# Patient Record
Sex: Female | Born: 1955 | Race: White | Hispanic: No | Marital: Married | Smoking: Never smoker
Health system: Southern US, Community
[De-identification: ages and names within clinical notes are randomized; demographics above are authoritative.]

## PROBLEM LIST (undated history)

## (undated) DIAGNOSIS — I10 Essential (primary) hypertension: Secondary | ICD-10-CM

## (undated) DIAGNOSIS — E119 Type 2 diabetes mellitus without complications: Secondary | ICD-10-CM

## (undated) DIAGNOSIS — M1990 Unspecified osteoarthritis, unspecified site: Secondary | ICD-10-CM

## (undated) DIAGNOSIS — R7301 Impaired fasting glucose: Secondary | ICD-10-CM

## (undated) DIAGNOSIS — J45909 Unspecified asthma, uncomplicated: Secondary | ICD-10-CM

## (undated) HISTORY — PX: EYE SURGERY: SHX253

## (undated) HISTORY — PX: HERNIA REPAIR: SHX51

## (undated) HISTORY — PX: CARDIAC SURGERY: SHX584

---

## 1998-02-24 ENCOUNTER — Inpatient Hospital Stay (HOSPITAL_COMMUNITY): Admission: AD | Admit: 1998-02-24 | Discharge: 1998-02-24 | Payer: Self-pay | Admitting: Gynecology

## 1998-09-11 ENCOUNTER — Inpatient Hospital Stay (HOSPITAL_COMMUNITY): Admission: AD | Admit: 1998-09-11 | Discharge: 1998-09-11 | Payer: Self-pay | Admitting: Obstetrics and Gynecology

## 1998-10-08 ENCOUNTER — Inpatient Hospital Stay (HOSPITAL_COMMUNITY): Admission: AD | Admit: 1998-10-08 | Discharge: 1998-10-08 | Payer: Self-pay | Admitting: Obstetrics & Gynecology

## 1998-10-15 ENCOUNTER — Inpatient Hospital Stay (HOSPITAL_COMMUNITY): Admission: AD | Admit: 1998-10-15 | Discharge: 1998-10-18 | Payer: Self-pay | Admitting: Obstetrics and Gynecology

## 1998-10-22 ENCOUNTER — Inpatient Hospital Stay (HOSPITAL_COMMUNITY): Admission: AD | Admit: 1998-10-22 | Discharge: 1998-10-22 | Payer: Self-pay | Admitting: *Deleted

## 1998-10-23 ENCOUNTER — Inpatient Hospital Stay (HOSPITAL_COMMUNITY): Admission: AD | Admit: 1998-10-23 | Discharge: 1998-10-23 | Payer: Self-pay | Admitting: Obstetrics & Gynecology

## 1999-07-08 ENCOUNTER — Other Ambulatory Visit: Admission: RE | Admit: 1999-07-08 | Discharge: 1999-07-08 | Payer: Self-pay | Admitting: Obstetrics and Gynecology

## 1999-12-07 ENCOUNTER — Inpatient Hospital Stay (HOSPITAL_COMMUNITY): Admission: AD | Admit: 1999-12-07 | Discharge: 1999-12-07 | Payer: Self-pay | Admitting: Obstetrics and Gynecology

## 2000-01-07 ENCOUNTER — Inpatient Hospital Stay (HOSPITAL_COMMUNITY): Admission: AD | Admit: 2000-01-07 | Discharge: 2000-01-10 | Payer: Self-pay | Admitting: Obstetrics and Gynecology

## 2000-01-07 ENCOUNTER — Encounter (INDEPENDENT_AMBULATORY_CARE_PROVIDER_SITE_OTHER): Payer: Self-pay

## 2000-03-10 ENCOUNTER — Other Ambulatory Visit: Admission: RE | Admit: 2000-03-10 | Discharge: 2000-03-10 | Payer: Self-pay | Admitting: Obstetrics and Gynecology

## 2002-11-29 ENCOUNTER — Other Ambulatory Visit: Admission: RE | Admit: 2002-11-29 | Discharge: 2002-11-29 | Payer: Self-pay | Admitting: Obstetrics and Gynecology

## 2003-01-25 ENCOUNTER — Encounter: Admission: RE | Admit: 2003-01-25 | Discharge: 2003-04-25 | Payer: Self-pay | Admitting: Obstetrics and Gynecology

## 2003-05-10 ENCOUNTER — Encounter: Admission: RE | Admit: 2003-05-10 | Discharge: 2003-08-08 | Payer: Self-pay | Admitting: Obstetrics and Gynecology

## 2003-05-30 ENCOUNTER — Encounter: Payer: Self-pay | Admitting: Family Medicine

## 2003-05-30 ENCOUNTER — Ambulatory Visit (HOSPITAL_COMMUNITY): Admission: RE | Admit: 2003-05-30 | Discharge: 2003-05-30 | Payer: Self-pay | Admitting: Family Medicine

## 2003-06-06 ENCOUNTER — Encounter: Payer: Self-pay | Admitting: Family Medicine

## 2003-06-06 ENCOUNTER — Ambulatory Visit (HOSPITAL_COMMUNITY): Admission: RE | Admit: 2003-06-06 | Discharge: 2003-06-06 | Payer: Self-pay | Admitting: Family Medicine

## 2003-06-15 ENCOUNTER — Encounter (INDEPENDENT_AMBULATORY_CARE_PROVIDER_SITE_OTHER): Payer: Self-pay | Admitting: *Deleted

## 2003-06-15 ENCOUNTER — Inpatient Hospital Stay (HOSPITAL_COMMUNITY): Admission: RE | Admit: 2003-06-15 | Discharge: 2003-06-17 | Payer: Self-pay | Admitting: General Surgery

## 2004-02-11 ENCOUNTER — Other Ambulatory Visit: Admission: RE | Admit: 2004-02-11 | Discharge: 2004-02-11 | Payer: Self-pay | Admitting: Gynecology

## 2005-08-24 ENCOUNTER — Other Ambulatory Visit: Admission: RE | Admit: 2005-08-24 | Discharge: 2005-08-24 | Payer: Self-pay | Admitting: Gynecology

## 2006-08-24 ENCOUNTER — Other Ambulatory Visit: Admission: RE | Admit: 2006-08-24 | Discharge: 2006-08-24 | Payer: Self-pay | Admitting: Gynecology

## 2007-08-18 ENCOUNTER — Encounter: Payer: Self-pay | Admitting: Emergency Medicine

## 2007-08-18 ENCOUNTER — Ambulatory Visit (HOSPITAL_COMMUNITY): Admission: RE | Admit: 2007-08-18 | Discharge: 2007-08-18 | Payer: Self-pay | Admitting: Family Medicine

## 2007-09-19 ENCOUNTER — Ambulatory Visit: Payer: Self-pay | Admitting: Pulmonary Disease

## 2007-09-19 DIAGNOSIS — E119 Type 2 diabetes mellitus without complications: Secondary | ICD-10-CM | POA: Insufficient documentation

## 2007-09-19 DIAGNOSIS — I10 Essential (primary) hypertension: Secondary | ICD-10-CM | POA: Insufficient documentation

## 2007-09-21 ENCOUNTER — Ambulatory Visit: Payer: Self-pay | Admitting: Cardiovascular Disease

## 2007-10-04 ENCOUNTER — Telehealth: Payer: Self-pay | Admitting: Pulmonary Disease

## 2007-10-24 ENCOUNTER — Other Ambulatory Visit: Admission: RE | Admit: 2007-10-24 | Discharge: 2007-10-24 | Payer: Self-pay | Admitting: Gynecology

## 2011-02-27 NOTE — Discharge Summary (Signed)
[facility]  Patient:    Terri Nicholson, Terri Nicholson                     MRN: [id number] Adm. Date:  [id number] Disc. Date: [id number] Attending:  Cordelia Pen Ii Dictator:   Danie Chandler, R.N.                           Discharge Summary  ADMITTING DIAGNOSES:          1. Intrauterine pregnancy at 49 weeks estimated                                  gestational age.                               2. Pregnancy-induced hypertension.                               3. Previous cesarean section x 3.  DISCHARGE DIAGNOSES:          1. Intrauterine pregnancy at 51 weeks estimated                                  gestational age.                               2. Pregnancy-induced hypertension.                               3. Previous cesarean section x 3.                               4. Umbilical hernia.                               5. Upper respiratory infection.  PROCEDURES:                   1. On January 07, 2000, repeat low transverse cesarean                                  section.                               2. Umbilical herniorrhaphy.  REASON FOR ADMISSION:         The patient is a 55 year old married white female  gravida 5 para 3 with an estimated date of confinement of January 14, 2000.  Her prenatal care has been complicated by cesarean section x 3, history of PIH with  previous pregnancy, and history of fibroids.  The patient presented to the office on January 07, 2000 and was noted to have a blood pressure of 150s/90s and 1+ proteinuria.  She had had increasing pitting pedal edema.  She had no headache, no vision change or epigastric pain.  The recommendation for delivery was made.  HOSPITAL COURSE:              The patient was taken to the operating room and underwent the above-named procedure without complication.  This was productive f a viable female infant with Apgars of 8 at one minute and 9 at five minutes. Postoperatively, the patient  did well.  On postoperative day #1, the patient had good pain control.  Her hemoglobin was 9.7, hematocrit 27.7, and white blood cell count 8.5.  Her blood pressures were stable.  On postoperative day #2, the patient had a good return of bowel function and was tolerating a regular diet.  She was  also ambulating well without difficulty.  Her hemoglobin was stable at 11.2. She was complaining of a productive cough with yellow phlegm, consistent with an upper respiratory infection, and she was requesting antibiotics.  She was started on amoxicillin.  On postoperative day #3, she was discharged home.  CONDITION ON DISCHARGE:       Good.  DIET:                         Regular as tolerated.  ACTIVITY:                     No heavy lifting, no driving, no vaginal entry.  FOLLOW-UP:                    She is to follow up in the office on January 12, 2000 for staple removal.  She is to call for temperature greater than 100 degrees, persistent nausea or vomiting, heavy vaginal bleeding, and/or redness or drainage from the incision site.  DISCHARGE MEDICATIONS:        1. Prenatal vitamin 1 p.o. q.d.                               2. Tylox 1-2 p.o. q.4-6h. p.r.n. pain.                               3. Amoxicillin 500 mg 1 p.o. t.i.d. DD:  01/28/00 TD:  01/28/00 Job: [id number]

## 2011-02-27 NOTE — Op Note (Signed)
NAMEMarland Nicholson  ASALEE, BARRETTE NO.:  [id number]   MEDICAL RECORD NO.:  [id number]                   PATIENT TYPE:  INP   LOCATION:  5730                                 FACILITY:  [facility]   PHYSICIAN:  Leonie Man, M.D.                DATE OF BIRTH:  10-10-1956   DATE OF PROCEDURE:  06/15/2003  DATE OF DISCHARGE:                                 OPERATIVE REPORT   PREOPERATIVE DIAGNOSES:  1. Ventral hernia with incarcerated transverse colon.  2. Umbilical hernia with incarcerated fat.   POSTOPERATIVE DIAGNOSES:  1. Ventral hernia with incarcerated transverse colon.  2. Umbilical hernia with incarcerated fat.   PROCEDURE:  Repair of ventral and umbilical hernias with mesh.   SURGEON:  Leonie Man, M.D.   ASSISTANT:  Nurse.   ANESTHESIA:  General.   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  On CT scan she is noted to have  incarcerated ventral hernias both at the umbilicus and superior to the  umbilicus.   After satisfactory general anesthesia, the patient is positioned supinely  and the abdomen is prepped and draped to be included in the sterile  operative field.  A midepigastric incision is deepened through the skin and  subcutaneous tissues, extending down to the umbilicus.  Dissection is  carried down to reveal a large hernia sac containing omentum within the soft  tissues of the abdominal wall.  This was dissected free on all sides,  carried down to the fascia, and the fascia was then closed upon.  The large  hernia sac was opened and the transverse colon and associated omentum was  dissected free from the hernia sac and reduced into the abdominal cavity.  The hernia sac was removed and forwarded for pathologic evaluation along  with some incarcerated umbilical fat.  The defect was cleared of all  adhesions.  I placed some Seprafilm within the abdominal cavity so as to  avoid  adhesions to the mesh.  I fashioned a Marlex mesh, which was sewn into  this 4 x 4 cm defect, which I later extended down toward the umbilicus to  include the umbilical defect also.  The mesh was sewn in with a running #1  Novofil suture.  The mesh repair was noted to be excellent.  Sponge,  instrument, and sharp counts were verified and the wound closed in layers.  The subcutaneous tissue was closed with a running 2-0 Vicryl and the skin  was closed with a running 4-0 Monocryl.  A 19 mm Blake drain was placed into  the dead space left by the incarcerated hernia in the abdominal wall.  This  was secured to the skin with a 4-0 Monocryl suture.  At the end of the  procedure all sponge, instrument, and sharp counts were verified and sterile  dressings were then applied, the anesthetic reversed,  and the patient  removed from the operating room to the recovery room in stable condition.  She tolerated the procedure well.                                               Leonie Man, M.D.   PB/MEDQ  D:  06/15/2003  T:  06/16/2003  Job:  [id number]

## 2011-02-27 NOTE — Op Note (Signed)
[facility]  Patient:    Terri Nicholson, Terri Nicholson                     MRN: [id number] Proc. Date: 01/07/00 Adm. Date:  [id number] Attending:  Cordelia Pen Ii                           Operative Report  PREOPERATIVE DIAGNOSES:       1. Intrauterine pregnancy at 59 weeks estimated                                  gestational age.                               2. Pregnancy induced hypertension.                               3. Previous cesarean section x 3.                               4. Possible maternal urachal cyst on ultrasound.  POSTOPERATIVE DIAGNOSES:      1. Intrauterine pregnancy at 20 weeks estimated                                  gestational age.                               2. Pregnancy induced hypertension.                               3. Previous cesarean section x 3.                               4. Umbilical hernia.  PROCEDURE:                    1. Repeat low transverse cesarean section.                               2. Umbilical herniorrhaphy.  SURGEON:                      Guy Sandifer. Arleta Creek, M.D.  ANESTHESIA:                   Spinal-epidural, general with endotracheal intubation, Laqueta Due, M.D.  ESTIMATED BLOOD LOSS:         1000 cc.  FINDINGS:                     Viable female infant.  Apgars of 8 and 9 at one and  five minutes respectively.  Birth weight 10 pounds 3 ounces.  Arterial cord pH pending.  INDICATIONS AND CONSENT:      This is a 55 year old married white female G5, P3, AB1 with an EDC of January 14, 2000 with prenatal care complicated by cesarean section x  3, history of PIH with previous pregnancies, history of fibroids, suspicion of a maternal urachal cyst on ultrasound.  Today the patient presented to office and was noted to have a blood pressure of 150s/90s and 1+ proteinuria.  She had had increasing pitting pedal edema.  She had no headache, no vision change, no epigastric pain.  Recommendation  for delivery was made.  Repeat cesarean section had been discussed.  Removal of a possible urachal cyst was also discussed. Possible risks and complications of the procedure had also been discussed.  PROCEDURE:                    Patient was taken to the operating room where an epidural anesthetic is placed and is ineffective.  This is then replaced with a  combined spinal-epidural approach.  The patient was then placed in the dorsal spine position with a 15 degree left lateral wedge where she is prepped.  Bladder is catheterization with a Foley catheter.  She is draped in a sterile fashion. After testing for adequate anesthesia, the skin is entered through a Pfannenstiel incision.  Dissection is carried out in layers.  The subfascial adhesions are very dense.  The peritoneum is entered and extended superiorly and inferiorly.  The bladder flap is developed.  The bladder blade is placed.  The uterus is incised in a low transverse manner.  The uterine incision extended ______ to fingers. Artificial rupture of membranes was then carried out for clear fluid.  The infant is then delivered from the vertex presentation.  Where nasopharynx was thoroughly suctioned.  Good cry and tone is noted.  The cord is clamped and cut.  The infant is handed away to pediatrics team.  Placenta is manually delivered and sent to pathology for examination.  The uterus is then closed in a running locking manner with 0 Monocryl suture which achieves good hemostasis.  Inspection of the potential urachal cyst reveals it is in fact an umbilical hernia with omentum adherent to it.  Just a simple exploration of this is extremely uncomfortable for the patient. She wants to have this repaired and therefore general anesthesia of the endotracheal intubation is then carried out.  Patient is then placed in some Trendelenburg position and the anterior abdominal wall is retracted to provide good exposure.  The small  amount of omentum adherent to the under side of the hernia is then easily taken down with cautery without difficulty.  This is done well clear of the bowel and the bowel had in fact been packed away with a ______ pack with a tag on it.  Then 0 ______ suture is used in an interrupted fashion to close the hernia. Careful inspection reveals the hernia to be completely closed with no gaps noticed.  The pack is then removed.  Copious irrigation is carried out and good hemostasis is noted.  Ovaries are normal bilaterally.  Anterior peritoneum is then closed n running fashion with 0 Monocryl suture which is also used to reapproximate the pyramidalis muscle in the midline.  Anterior rectus fascia is closed in a running fashion with 0 PDS suture and the skin is closed with clips.  All sponge, instrument, and needle counts are correct and the patient is transferred to the  recovery room in stable condition. DD:  01/07/00 TD:  01/07/00 Job: [id number]

## 2011-02-27 NOTE — Discharge Summary (Signed)
   NAMEALTAIR, STANKO NO.:  [id number]   MEDICAL RECORD NO.:  [id number]                   PATIENT TYPE:  INP   LOCATION:  5730                                 FACILITY:  [facility]   PHYSICIAN:  Leonie Man, M.D.                DATE OF BIRTH:  03-15-1956   DATE OF ADMISSION:  06/15/2003  DATE OF DISCHARGE:  06/17/2003                                 DISCHARGE SUMMARY   ADMISSION DIAGNOSES:  1. Incarcerated ventral hernia.  2. Incarcerated umbilical hernia.   DISCHARGE DIAGNOSES:  1. Incarcerated ventral hernia.  2. Incarcerated umbilical hernia.   PROCEDURE:  Repair of incarcerated ventral and incarcerated umbilical  hernias.   SURGEON:  Leonie Man, M.D.   DISCHARGE CONDITION:  Improved.   HOSPITAL COURSE:  The patient is a 55 year old girl admitted to the hospital  following surgery on June 15, 2003, where she underwent repair of an  incarcerated ventral and umbilical hernia.  Postoperative course has been  benign with resumption of diet and activity.  On June 17, 2003, she was  discharged home to be followed up in the office in two weeks.   DISCHARGE MEDICATIONS:  Vicodin one to two every four hours p.r.n. for pain.   ACTIVITY:  Ad lib.   DIET:  As tolerated.                                                Leonie Man, M.D.    PB/MEDQ  D:  07/12/2003  T:  07/12/2003  Job:  [id number]

## 2012-04-05 ENCOUNTER — Emergency Department
Admission: EM | Admit: 2012-04-05 | Discharge: 2012-04-05 | Disposition: A | Discharge status: Home / Self Care | Payer: PRIVATE HEALTH INSURANCE | Source: Home / Self Care | Attending: Emergency Medicine | Admitting: Emergency Medicine

## 2012-04-05 DIAGNOSIS — J069 Acute upper respiratory infection, unspecified: Secondary | ICD-10-CM

## 2012-04-05 DIAGNOSIS — R05 Cough: Secondary | ICD-10-CM

## 2012-04-05 DIAGNOSIS — R062 Wheezing: Secondary | ICD-10-CM

## 2012-04-05 HISTORY — DX: Essential (primary) hypertension: I10

## 2012-04-05 HISTORY — DX: Unspecified asthma, uncomplicated: J45.909

## 2012-04-05 HISTORY — DX: Impaired fasting glucose: R73.01

## 2012-04-05 MED ORDER — PREDNISONE (PAK) 10 MG PO TABS: 10.0000 mg | ORAL_TABLET | Freq: Every day | ORAL | Status: AC

## 2012-04-05 MED ORDER — IPRATROPIUM-ALBUTEROL 0.5-2.5 (3) MG/3ML IN SOLN
3.0000 mL | Freq: Once | RESPIRATORY_TRACT | Status: AC
  Administered 2012-04-05: 3 mL via RESPIRATORY_TRACT

## 2012-04-05 MED ORDER — METHYLPREDNISOLONE SODIUM SUCC 125 MG IJ SOLR
125.0000 mg | Freq: Once | INTRAMUSCULAR | Status: AC
  Administered 2012-04-05: 125 mg via INTRAMUSCULAR

## 2012-04-05 MED ORDER — AZITHROMYCIN 250 MG PO TABS: ORAL_TABLET | ORAL | Status: AC

## 2012-04-05 NOTE — ED Provider Notes (Signed)
History     CSN: [id number]  Arrival date & time 04/05/12  1924   First MD Initiated Contact with Patient 04/05/12 1927      Chief Complaint  Patient presents with  . Cough    3 days    (Consider location/radiation/quality/duration/timing/severity/associated sxs/prior treatment) HPI Terri Nicholson is a 56 y.o. female who complains of onset of cold symptoms for a few days.  The symptoms are constant and mild-moderate in severity.  She has mild diabetes.  Her husband has similar symptoms and isn't getting much better on Amoxicillin. No sore throat + cough + hoarseness No pleuritic pain + wheezing + nasal congestion + post-nasal drainage + sinus pain/pressure + chest congestion No itchy/red eyes No earache No hemoptysis No SOB No chills/sweats +/- fever No nausea No vomiting No abdominal pain No diarrhea No skin rashes + fatigue No myalgias No headache    Past Medical History  Diagnosis Date  . Asthma   . Hypertension   . IFG (impaired fasting glucose)     Past Surgical History  Procedure Date  . Cardiac surgery   . Hernia repair   . Cesarean section     Family History  Problem Relation Age of Onset  . Cancer Father     skin    History  Substance Use Topics  . Smoking status: Never Smoker   . Smokeless tobacco: Never Used  . Alcohol Use: No    OB History    Grav Para Term Preterm Abortions TAB SAB Ect Mult Living                  Review of Systems  All other systems reviewed and are negative.    Allergies  Review of patient's allergies indicates no known allergies.  Home Medications   Current Outpatient Rx  Name Route Sig Dispense Refill  . ALBUTEROL SULFATE HFA 108 (90 BASE) MCG/ACT IN AERS Inhalation Inhale 2 puffs into the lungs every 6 (six) hours as needed.    . BUDESONIDE-FORMOTEROL FUMARATE 160-4.5 MCG/ACT IN AERO Inhalation Inhale 2 puffs into the lungs 2 (two) times daily.    . OMEGA-3 FATTY ACIDS 1000 MG PO CAPS Oral Take 2 g by  mouth daily.    Marland Kitchen LOSARTAN POTASSIUM 50 MG PO TABS Oral Take 50 mg by mouth daily.    Marland Kitchen PRENATAL MULTIVITAMIN CH Oral Take 1 tablet by mouth daily.    . TRIAMTERENE-HCTZ 37.5-25 MG PO TABS Oral Take 1 tablet by mouth daily.    . AZITHROMYCIN 250 MG PO TABS  Use as directed 1 each 0  . PREDNISONE (PAK) 10 MG PO TABS Oral Take 1 tablet (10 mg total) by mouth daily. 6 day pack, use as directed, Disp 1 pack 21 tablet 0    BP 119/82  Pulse 100  Temp 98 F (36.7 C) (Oral)  Resp 18  Ht 5' (1.524 m)  Wt 221 lb (100.245 kg)  BMI 43.16 kg/m2  SpO2 96%  Physical Exam  Nursing note and vitals reviewed. Constitutional: She is oriented to person, place, and time. She appears well-developed and well-nourished.  Non-toxic appearance. No distress.  HENT:  Head: Normocephalic and atraumatic.  Right Ear: Tympanic membrane, external ear and ear canal normal.  Left Ear: Tympanic membrane, external ear and ear canal normal.  Nose: Mucosal edema present.  Mouth/Throat: No oropharyngeal exudate, posterior oropharyngeal edema or posterior oropharyngeal erythema.  Eyes: No scleral icterus.  Neck: Neck supple.  Cardiovascular: Regular rhythm and normal  heart sounds.   Pulmonary/Chest: Effort normal. No accessory muscle usage. No respiratory distress. She has no decreased breath sounds. She has wheezes (scattered bilateral). She has no rhonchi.  Neurological: She is alert and oriented to person, place, and time.  Skin: Skin is warm and dry.  Psychiatric: She has a normal mood and affect. Her speech is normal.    ED Course  Procedures (including critical care time)  Labs Reviewed - No data to display No results found.   1. Acute upper respiratory infections of unspecified site   2. Cough       MDM  1)  a nebulizer treatment with DuoNeb was done in clinic.  After that nebulizer treatment, she felt much better, able to breathe much better.  In addition her examination improved greatly and she had  no wheezes.  I gave her a shot of site Medrol in clinic.  I also gave her prescription for prednisone and a Z-Pak.  She can hold off until tomorrow on that medicine and she may not even need it if she is much improved. 2)  Use nasal saline solution (over the counter) at least 3 times a day. 3)  Use over the counter decongestants like Zyrtec-D every 12 hours as needed to help with congestion.  If you have hypertension, do not take medicines with sudafed.  4)  Can take tylenol every 6 hours for pain or fever. 5)  Follow up with your primary doctor if no improvement in 5-7 days, sooner if increasing pain, fever, or new symptoms.     Marlaine Hind, MD 04/05/12 2000

## 2012-04-05 NOTE — ED Notes (Signed)
Terri Nicholson complains of productive cough with yellow sputum and shortness of breath with activity for 3 days

## 2015-01-14 ENCOUNTER — Encounter: Payer: Self-pay | Admitting: *Deleted

## 2015-02-14 ENCOUNTER — Other Ambulatory Visit: Payer: Self-pay | Admitting: Orthopedic Surgery

## 2015-02-14 DIAGNOSIS — M25571 Pain in right ankle and joints of right foot: Secondary | ICD-10-CM

## 2015-02-18 ENCOUNTER — Emergency Department
Admission: EM | Admit: 2015-02-18 | Discharge: 2015-02-19 | Disposition: A | Discharge status: Home / Self Care | Payer: PRIVATE HEALTH INSURANCE | Attending: Emergency Medicine | Admitting: Emergency Medicine

## 2015-02-18 ENCOUNTER — Other Ambulatory Visit: Payer: Self-pay

## 2015-02-18 ENCOUNTER — Emergency Department: Payer: PRIVATE HEALTH INSURANCE

## 2015-02-18 DIAGNOSIS — Z79899 Other long term (current) drug therapy: Secondary | ICD-10-CM | POA: Insufficient documentation

## 2015-02-18 DIAGNOSIS — Y998 Other external cause status: Secondary | ICD-10-CM | POA: Diagnosis not present

## 2015-02-18 DIAGNOSIS — W01198A Fall on same level from slipping, tripping and stumbling with subsequent striking against other object, initial encounter: Secondary | ICD-10-CM | POA: Diagnosis not present

## 2015-02-18 DIAGNOSIS — S060X0A Concussion without loss of consciousness, initial encounter: Secondary | ICD-10-CM | POA: Insufficient documentation

## 2015-02-18 DIAGNOSIS — Y9289 Other specified places as the place of occurrence of the external cause: Secondary | ICD-10-CM | POA: Insufficient documentation

## 2015-02-18 DIAGNOSIS — Y9389 Activity, other specified: Secondary | ICD-10-CM | POA: Insufficient documentation

## 2015-02-18 DIAGNOSIS — I10 Essential (primary) hypertension: Secondary | ICD-10-CM | POA: Diagnosis not present

## 2015-02-18 DIAGNOSIS — S0990XA Unspecified injury of head, initial encounter: Secondary | ICD-10-CM | POA: Diagnosis present

## 2015-02-18 LAB — BASIC METABOLIC PANEL
Anion gap: 11 (ref 5–15)
BUN: 21 mg/dL — AB (ref 6–20)
CALCIUM: 9.3 mg/dL (ref 8.9–10.3)
CO2: 28 mmol/L (ref 22–32)
Chloride: 99 mmol/L — ABNORMAL LOW (ref 101–111)
Creatinine, Ser: 0.93 mg/dL (ref 0.44–1.00)
Glucose, Bld: 110 mg/dL — ABNORMAL HIGH (ref 65–99)
Potassium: 3.6 mmol/L (ref 3.5–5.1)
SODIUM: 138 mmol/L (ref 135–145)

## 2015-02-18 LAB — CBC
HCT: 37.9 % (ref 35.0–47.0)
HEMOGLOBIN: 13 g/dL (ref 12.0–16.0)
MCH: 34.1 pg — AB (ref 26.0–34.0)
MCHC: 34.2 g/dL (ref 32.0–36.0)
MCV: 99.6 fL (ref 80.0–100.0)
PLATELETS: 292 10*3/uL (ref 150–440)
RBC: 3.81 MIL/uL (ref 3.80–5.20)
RDW: 14.3 % (ref 11.5–14.5)
WBC: 8.1 10*3/uL (ref 3.6–11.0)
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
## 2015-02-18 NOTE — ED Notes (Signed)
Pt is unsure if she had an loc,

## 2015-02-19 ENCOUNTER — Emergency Department: Payer: PRIVATE HEALTH INSURANCE

## 2015-02-19 MED ORDER — OXYCODONE-ACETAMINOPHEN 5-325 MG PO TABS
1.0000 | ORAL_TABLET | Freq: Once | ORAL | Status: AC
  Administered 2015-02-19: 1 via ORAL

## 2015-02-19 MED ORDER — OXYCODONE-ACETAMINOPHEN 5-325 MG PO TABS
ORAL_TABLET | ORAL | Status: AC
  Filled 2015-02-19: qty 1

## 2015-02-19 MED ORDER — ONDANSETRON 8 MG PO TBDP
8.0000 mg | ORAL_TABLET | Freq: Once | ORAL | Status: AC
Start: 2015-02-19 — End: 2015-02-19
  Administered 2015-02-19: 8 mg via ORAL

## 2015-02-19 MED ORDER — ONDANSETRON 8 MG PO TBDP
ORAL_TABLET | ORAL | Status: AC
  Filled 2015-02-19: qty 1

## 2015-02-19 NOTE — Discharge Instructions (Signed)
Concussion  A concussion, or closed-head injury, is a brain injury caused by a direct blow to the head or by a quick and sudden movement (jolt) of the head or neck. Concussions are usually not life-threatening. Even so, the effects of a concussion can be serious. If you have had a concussion before, you are more likely to experience concussion-like symptoms after a direct blow to the head.   CAUSES  · Direct blow to the head, such as from running into another player during a soccer game, being hit in a fight, or hitting your head on a hard surface.  · A jolt of the head or neck that causes the brain to move back and forth inside the skull, such as in a car crash.  SIGNS AND SYMPTOMS  The signs of a concussion can be hard to notice. Early on, they may be missed by you, family members, and health care providers. You may look fine but act or feel differently.  Symptoms are usually temporary, but they may last for days, weeks, or even longer. Some symptoms may appear right away while others may not show up for hours or days. Every head injury is different. Symptoms include:  · Mild to moderate headaches that will not go away.  · A feeling of pressure inside your head.  · Having more trouble than usual:  ¨ Learning or remembering things you have heard.  ¨ Answering questions.  ¨ Paying attention or concentrating.  ¨ Organizing daily tasks.  ¨ Making decisions and solving problems.  · Slowness in thinking, acting or reacting, speaking, or reading.  · Getting lost or being easily confused.  · Feeling tired all the time or lacking energy (fatigued).  · Feeling drowsy.  · Sleep disturbances.  ¨ Sleeping more than usual.  ¨ Sleeping less than usual.  ¨ Trouble falling asleep.  ¨ Trouble sleeping (insomnia).  · Loss of balance or feeling lightheaded or dizzy.  · Nausea or vomiting.  · Numbness or tingling.  · Increased sensitivity to:  ¨ Sounds.  ¨ Lights.  ¨ Distractions.  · Vision problems or eyes that tire  easily.  · Diminished sense of taste or smell.  · Ringing in the ears.  · Mood changes such as feeling sad or anxious.  · Becoming easily irritated or angry for little or no reason.  · Lack of motivation.  · Seeing or hearing things other people do not see or hear (hallucinations).  DIAGNOSIS  Your health care provider can usually diagnose a concussion based on a description of your injury and symptoms. He or she will ask whether you passed out (lost consciousness) and whether you are having trouble remembering events that happened right before and during your injury.  Your evaluation might include:  · A brain scan to look for signs of injury to the brain. Even if the test shows no injury, you may still have a concussion.  · Blood tests to be sure other problems are not present.  TREATMENT  · Concussions are usually treated in an emergency department, in urgent care, or at a clinic. You may need to stay in the hospital overnight for further treatment.  · Tell your health care provider if you are taking any medicines, including prescription medicines, over-the-counter medicines, and natural remedies. Some medicines, such as blood thinners (anticoagulants) and aspirin, may increase the chance of complications. Also tell your health care provider whether you have had alcohol or are taking illegal drugs. This information   may affect treatment.  · Your health care provider will send you home with important instructions to follow.  · How fast you will recover from a concussion depends on many factors. These factors include how severe your concussion is, what part of your brain was injured, your age, and how healthy you were before the concussion.  · Most people with mild injuries recover fully. Recovery can take time. In general, recovery is slower in older persons. Also, persons who have had a concussion in the past or have other medical problems may find that it takes longer to recover from their current injury.  HOME  CARE INSTRUCTIONS  General Instructions  · Carefully follow the directions your health care provider gave you.  · Only take over-the-counter or prescription medicines for pain, discomfort, or fever as directed by your health care provider.  · Take only those medicines that your health care provider has approved.  · Do not drink alcohol until your health care provider says you are well enough to do so. Alcohol and certain other drugs may slow your recovery and can put you at risk of further injury.  · If it is harder than usual to remember things, write them down.  · If you are easily distracted, try to do one thing at a time. For example, do not try to watch TV while fixing dinner.  · Talk with family members or close friends when making important decisions.  · Keep all follow-up appointments. Repeated evaluation of your symptoms is recommended for your recovery.  · Watch your symptoms and tell others to do the same. Complications sometimes occur after a concussion. Older adults with a brain injury may have a higher risk of serious complications, such as a blood clot on the brain.  · Tell your teachers, school nurse, school counselor, coach, athletic trainer, or work manager about your injury, symptoms, and restrictions. Tell them about what you can or cannot do. They should watch for:  ¨ Increased problems with attention or concentration.  ¨ Increased difficulty remembering or learning new information.  ¨ Increased time needed to complete tasks or assignments.  ¨ Increased irritability or decreased ability to cope with stress.  ¨ Increased symptoms.  · Rest. Rest helps the brain to heal. Make sure you:  ¨ Get plenty of sleep at night. Avoid staying up late at night.  ¨ Keep the same bedtime hours on weekends and weekdays.  ¨ Rest during the day. Take daytime naps or rest breaks when you feel tired.  · Limit activities that require a lot of thought or concentration. These include:  ¨ Doing homework or job-related  work.  ¨ Watching TV.  ¨ Working on the computer.  · Avoid any situation where there is potential for another head injury (football, hockey, soccer, basketball, martial arts, downhill snow sports and horseback riding). Your condition will get worse every time you experience a concussion. You should avoid these activities until you are evaluated by the appropriate follow-up health care providers.  Returning To Your Regular Activities  You will need to return to your normal activities slowly, not all at once. You must give your body and brain enough time for recovery.  · Do not return to sports or other athletic activities until your health care provider tells you it is safe to do so.  · Ask your health care provider when you can drive, ride a bicycle, or operate heavy machinery. Your ability to react may be slower after a   brain injury. Never do these activities if you are dizzy.  · Ask your health care provider about when you can return to work or school.  Preventing Another Concussion  It is very important to avoid another brain injury, especially before you have recovered. In rare cases, another injury can lead to permanent brain damage, brain swelling, or death. The risk of this is greatest during the first 7-10 days after a head injury. Avoid injuries by:  · Wearing a seat belt when riding in a car.  · Drinking alcohol only in moderation.  · Wearing a helmet when biking, skiing, skateboarding, skating, or doing similar activities.  · Avoiding activities that could lead to a second concussion, such as contact or recreational sports, until your health care provider says it is okay.  · Taking safety measures in your home.  ¨ Remove clutter and tripping hazards from floors and stairways.  ¨ Use grab bars in bathrooms and handrails by stairs.  ¨ Place non-slip mats on floors and in bathtubs.  ¨ Improve lighting in dim areas.  SEEK MEDICAL CARE IF:  · You have increased problems paying attention or  concentrating.  · You have increased difficulty remembering or learning new information.  · You need more time to complete tasks or assignments than before.  · You have increased irritability or decreased ability to cope with stress.  · You have more symptoms than before.  Seek medical care if you have any of the following symptoms for more than 2 weeks after your injury:  · Lasting (chronic) headaches.  · Dizziness or balance problems.  · Nausea.  · Vision problems.  · Increased sensitivity to noise or light.  · Depression or mood swings.  · Anxiety or irritability.  · Memory problems.  · Difficulty concentrating or paying attention.  · Sleep problems.  · Feeling tired all the time.  SEEK IMMEDIATE MEDICAL CARE IF:  · You have severe or worsening headaches. These may be a sign of a blood clot in the brain.  · You have weakness (even if only in one hand, leg, or part of the face).  · You have numbness.  · You have decreased coordination.  · You vomit repeatedly.  · You have increased sleepiness.  · One pupil is larger than the other.  · You have convulsions.  · You have slurred speech.  · You have increased confusion. This may be a sign of a blood clot in the brain.  · You have increased restlessness, agitation, or irritability.  · You are unable to recognize people or places.  · You have neck pain.  · It is difficult to wake you up.  · You have unusual behavior changes.  · You lose consciousness.  MAKE SURE YOU:  · Understand these instructions.  · Will watch your condition.  · Will get help right away if you are not doing well or get worse.  Document Released: 12/19/2003 Document Revised: 10/03/2013 Document Reviewed: 04/20/2013  [employer]® Patient Information ©2015 [employer]. This information is not intended to replace advice given to you by your health care provider. Make sure you discuss any questions you have with your health care provider.

## 2015-02-19 NOTE — ED Provider Notes (Signed)
[facility] Emergency Department Provider Note  ____________________________________________  Time seen: 11:55 PM  I have reviewed the triage vital signs and the nursing notes.   HISTORY  Chief Complaint Fall and Headache      HPI Terri Nicholson is a 59 y.o. female presents with history of trip and fall in a parking lot while in route to work tonight. Patient admits to striking the right periorbital region, positive LOC. Patient admits to nausea and 7 out of 10 pain at present     Past Medical History  Diagnosis Date  . Asthma   . Hypertension   . IFG (impaired fasting glucose)     Patient Active Problem List   Diagnosis Date Noted  . DIABETES MELLITUS, TYPE II 09/19/2007  . HYPERTENSION 09/19/2007    Past Surgical History  Procedure Laterality Date  . Cardiac surgery    . Hernia repair    . Cesarean section      Current Outpatient Rx  Name  Route  Sig  Dispense  Refill  . albuterol (PROVENTIL HFA;VENTOLIN HFA) 108 (90 BASE) MCG/ACT inhaler   Inhalation   Inhale 2 puffs into the lungs every 6 (six) hours as needed.         . budesonide-formoterol (SYMBICORT) 160-4.5 MCG/ACT inhaler   Inhalation   Inhale 2 puffs into the lungs 2 (two) times daily.         . fish oil-omega-3 fatty acids 1000 MG capsule   Oral   Take 2 g by mouth daily.         Marland Kitchen. losartan (COZAAR) 50 MG tablet   Oral   Take 50 mg by mouth daily.         . Prenatal Vit-Fe Fumarate-FA (PRENATAL MULTIVITAMIN) TABS   Oral   Take 1 tablet by mouth daily.         Marland Kitchen. triamterene-hydrochlorothiazide (MAXZIDE-25) 37.5-25 MG per tablet   Oral   Take 1 tablet by mouth daily.           Allergies Review of patient's allergies indicates no known allergies.  Family History  Problem Relation Age of Onset  . Cancer Father     skin    Social History History  Substance Use Topics  . Smoking status: Never Smoker   . Smokeless tobacco: Never Used  .  Alcohol Use: No    Review of Systems  Constitutional: Negative for fever. Eyes: Negative for visual changes. ENT: Negative for sore throat. Cardiovascular: Negative for chest pain. Respiratory: Negative for shortness of breath. Gastrointestinal: Negative for abdominal pain, vomiting and diarrhea. Genitourinary: Negative for dysuria. Musculoskeletal: Negative for back pain. Skin: Negative for rash. Neurological: Negative for headaches, focal weakness or numbness.   10-point ROS otherwise negative.  ____________________________________________   PHYSICAL EXAM:  VITAL SIGNS: ED Triage Vitals  Enc Vitals Group     BP 02/18/15 1957 119/77 mmHg     Pulse Rate 02/18/15 1957 90     Resp 02/18/15 1957 17     Temp 02/18/15 1957 98.3 F (36.8 C)     Temp Source 02/18/15 1957 Oral     SpO2 02/18/15 1957 98 %     Weight 02/18/15 1957 220 lb (99.791 kg)     Height 02/18/15 1957 5' (1.524 m)     Head Cir --      Peak Flow --      Pain Score 02/18/15 2304 7     Pain Loc --  Pain Edu? --      Excl. in GC? --      Constitutional: Alert and oriented. Well appearing and in no distress. Eyes: Conjunctivae are normal. PERRL. Normal extraocular movements. ENT   Head: right lateral periorbital abrasion   Nose: No congestion/rhinnorhea.   Mouth/Throat: Mucous membranes are moist.   Neck: No stridor. Cardiovascular: Normal rate, regular rhythm. Normal and symmetric distal pulses are present in all extremities. No murmurs, rubs, or gallops. Respiratory: Normal respiratory effort without tachypnea nor retractions. Breath sounds are clear and equal bilaterally. No wheezes/rales/rhonchi. Gastrointestinal: Soft and nontender. No distention. There is no CVA tenderness. Genitourinary: deferred Musculoskeletal:right knee pain with palpation inferior to the patella. Neurologic:  Normal speech and language. No gross focal neurologic deficits are appreciated. Speech is normal.   Skin:  Skin is warm, dry and intact. No rash noted. Psychiatric: Mood and affect are normal. Speech and behavior are normal. Patient exhibits appropriate insight and judgment.  ____________________________________________      ____________________________________________    RADIOLOGY CT head negative, right knee x-ray negative septal osteoarthritis  ____________________________________________     ____________________________________________   INITIAL IMPRESSION / ASSESSMENT AND PLAN / ED COURSE  Pertinent labs & imaging results that were available during my care of the patient were reviewed by me and considered in my medical decision making (see chart for details).  Given history and physical exam negative CT head concern for concussion. Patient and family advised of such.  ____________________________________________   FINAL CLINICAL IMPRESSION(S) / ED DIAGNOSES  Final diagnoses:  Concussion with no loss of consciousness, initial encounter      Darci Currentandolph N Brown, MD 02/19/15 [phone]

## 2015-02-26 ENCOUNTER — Other Ambulatory Visit: Payer: PRIVATE HEALTH INSURANCE

## 2016-05-08 ENCOUNTER — Emergency Department
Admission: EM | Admit: 2016-05-08 | Discharge: 2016-05-08 | Disposition: A | Discharge status: Home / Self Care | Payer: Managed Care, Other (non HMO) | Source: Home / Self Care | Attending: Family Medicine | Admitting: Family Medicine

## 2016-05-08 ENCOUNTER — Encounter: Payer: Self-pay | Admitting: *Deleted

## 2016-05-08 DIAGNOSIS — H6123 Impacted cerumen, bilateral: Secondary | ICD-10-CM

## 2016-05-08 HISTORY — DX: Type 2 diabetes mellitus without complications: E11.9

## 2016-05-08 HISTORY — DX: Unspecified osteoarthritis, unspecified site: M19.90

## 2016-05-08 MED ORDER — CARBAMIDE PEROXIDE 6.5 % OT SOLN: 5.0000 [drp] | Freq: Two times a day (BID) | OTIC | 0 refills | Status: AC

## 2016-05-08 NOTE — ED Provider Notes (Signed)
CSN: [id number]     Arrival date & time 05/08/16  1255 History   First MD Initiated Contact with Patient 05/08/16 1327     Chief Complaint  Patient presents with  . Ear Fullness   (Consider location/radiation/quality/duration/timing/severity/associated sxs/prior Treatment) HPI  Terri Nicholson is a 60 y.o. female presenting to UC with c/o bilateral ear fullness, worse in Right ear, gradually worsening over the last 2 weeks.  Denies pain. She has been taking her allergy medication including Benadryl, Zyrtec, and Naprosyn but no relief. Denies fever, chills, cough, congestion, or sore throat. She does report hx of cerumen impaction in the past.    Past Medical History:  Diagnosis Date  . Arthritis   . Asthma   . Diabetes mellitus without complication (HCC)   . Hypertension   . IFG (impaired fasting glucose)    Past Surgical History:  Procedure Laterality Date  . CARDIAC SURGERY    . CESAREAN SECTION    . EYE SURGERY    . HERNIA REPAIR     Family History  Problem Relation Age of Onset  . Cancer Father     skin   Social History  Substance Use Topics  . Smoking status: Never Smoker  . Smokeless tobacco: Never Used  . Alcohol use No   OB History    No data available     Review of Systems  Constitutional: Negative for chills and fever.  HENT: Positive for hearing loss (Right ear "fullness"). Negative for congestion, ear pain, rhinorrhea, sinus pressure and sore throat.   Respiratory: Negative for cough and wheezing.   Gastrointestinal: Negative for nausea and vomiting.    Allergies  Review of patient's allergies indicates no known allergies.  Home Medications   Prior to Admission medications   Medication Sig Start Date End Date Taking? Authorizing Provider  albuterol (PROVENTIL HFA;VENTOLIN HFA) 108 (90 BASE) MCG/ACT inhaler Inhale 2 puffs into the lungs every 6 (six) hours as needed.   Yes Historical Provider, MD  Biotin 1 MG CAPS Take by mouth.   Yes Historical  Provider, MD  citalopram (CELEXA) 20 MG tablet Take 20 mg by mouth daily.   Yes Historical Provider, MD  Exenatide ER (BYDUREON) 2 MG PEN Inject into the skin.   Yes Historical Provider, MD  losartan (COZAAR) 50 MG tablet Take 50 mg by mouth daily.   Yes Historical Provider, MD  meloxicam (MOBIC) 15 MG tablet Take 15 mg by mouth daily.   Yes Historical Provider, MD  triamterene-hydrochlorothiazide (MAXZIDE-25) 37.5-25 MG per tablet Take 1 tablet by mouth daily.   Yes Historical Provider, MD  budesonide-formoterol (SYMBICORT) 160-4.5 MCG/ACT inhaler Inhale 2 puffs into the lungs 2 (two) times daily.    Historical Provider, MD  carbamide peroxide (DEBROX) 6.5 % otic solution Place 5 drops into both ears 2 (two) times daily. 05/08/16   Junius Finner, PA-C  fish oil-omega-3 fatty acids 1000 MG capsule Take 2 g by mouth daily.    Historical Provider, MD  Prenatal Vit-Fe Fumarate-FA (PRENATAL MULTIVITAMIN) TABS Take 1 tablet by mouth daily.    Historical Provider, MD   Meds Ordered and Administered this Visit  Medications - No data to display  BP 115/79 (BP Location: Left Arm)   Pulse 99   Temp 98.7 F (37.1 C) (Oral)   Resp 16   Ht 5' (1.524 m)   Wt 215 lb (97.5 kg)   SpO2 98%   BMI 41.99 kg/m  No data found.   Physical  Exam  Constitutional: She is oriented to person, place, and time. She appears well-developed and well-nourished.  HENT:  Head: Normocephalic and atraumatic.  Nose: Nose normal.  Mouth/Throat: Uvula is midline, oropharynx is clear and moist and mucous membranes are normal.  Bilateral cerumen impaction.  Right ear: majority of TM occluded, amount of TM visible does not appear infected. No bleeding or discharge. Left ear- partial cerumen impaction. Normal TM  Eyes: EOM are normal.  Neck: Normal range of motion.  Cardiovascular: Normal rate and regular rhythm.   Pulmonary/Chest: Effort normal. No respiratory distress. She has no wheezes.  Musculoskeletal: Normal range of  motion.  Lymphadenopathy:    She has no cervical adenopathy.  Neurological: She is alert and oriented to person, place, and time.  Skin: Skin is warm and dry.  Psychiatric: She has a normal mood and affect. Her behavior is normal.  Nursing note and vitals reviewed.   Urgent Care Course   Clinical Course    Procedures (including critical care time)  Labs Review Labs Reviewed - No data to display  Imaging Review No results found.   MDM   1. Cerumen impaction, bilateral    Pt c/o ear fullness with no other symptoms. Denies pain. Cerumen impaction noted on exam. Offered to have cerumen removed by flushing technique. Pt declined stating last time she had that happen she had to go to ENT as it pushed it back even further. Pt plans on going to ENT as suction can be used there.  Rx: Debrox cerumen softener Resource guide for ENT provided. Also encouraged to f/u with PCP as needed. Patient verbalized understanding and agreement with treatment plan.    Junius Finner, PA-C 05/08/16 1347

## 2016-05-08 NOTE — ED Triage Notes (Signed)
Pt c/o RT ear fullness and pressure x 2 wks. Denies fever. She has taken Benadryl, Zyrtec and Naprosyn without relief.

## 2017-03-04 DIAGNOSIS — R002 Palpitations: Secondary | ICD-10-CM | POA: Insufficient documentation

## 2017-03-10 ENCOUNTER — Ambulatory Visit (INDEPENDENT_AMBULATORY_CARE_PROVIDER_SITE_OTHER): Payer: Managed Care, Other (non HMO)

## 2017-03-10 DIAGNOSIS — R002 Palpitations: Secondary | ICD-10-CM

## 2017-12-28 ENCOUNTER — Ambulatory Visit (INDEPENDENT_AMBULATORY_CARE_PROVIDER_SITE_OTHER): Payer: Managed Care, Other (non HMO)

## 2017-12-28 ENCOUNTER — Encounter: Payer: Self-pay | Admitting: Family Medicine

## 2017-12-28 ENCOUNTER — Ambulatory Visit: Payer: Managed Care, Other (non HMO) | Admitting: Family Medicine

## 2017-12-28 VITALS — BP 145/81 | HR 98 | Ht 60.0 in | Wt 220.0 lb

## 2017-12-28 DIAGNOSIS — M25512 Pain in left shoulder: Secondary | ICD-10-CM | POA: Diagnosis not present

## 2017-12-28 DIAGNOSIS — M75122 Complete rotator cuff tear or rupture of left shoulder, not specified as traumatic: Secondary | ICD-10-CM

## 2017-12-28 NOTE — Progress Notes (Signed)
Subjective:    I'm seeing this patient as a consultation for:  Johny Blamer, MD   CC: left shoulder pain  HPI: Terri Nicholson notes a one-month history of left shoulder pain associated with weakness especially with overhead motion.  She was in her normal state of health and rolled over in bed.  While she was doing that she felt a pop and had immediate pain.  Since then she notes continued pain in the anterior shoulder and lateral upper arm.  Pain is worse with attempted abduction.  She denies any radiating pain weakness or numbness distally beyond her shoulder.  No fevers or chills.  No falls.  Past medical history, Surgical history, Family history not pertinant except as noted below, Social history, Allergies, and medications have been entered into the medical record, reviewed, and no changes needed.   Review of Systems: No headache, visual changes, nausea, vomiting, diarrhea, constipation, dizziness, abdominal pain, skin rash, fevers, chills, night sweats, weight loss, swollen lymph nodes, body aches, joint swelling, muscle aches, chest pain, shortness of breath, mood changes, visual or auditory hallucinations.   Objective:    Vitals:   12/28/17 1503  BP: (!) 145/81  Pulse: 98   General: Well Developed, well nourished, and in no acute distress.  Neuro/Psych: Alert and oriented x3, extra-ocular muscles intact, able to move all 4 extremities, sensation grossly intact. Skin: Warm and dry, no rashes noted.  Respiratory: Not using accessory muscles, speaking in full sentences, trachea midline.  Cardiovascular: Pulses palpable, no extremity edema. Abdomen: Does not appear distended. MSK:  C-spine nontender normal neck motion Left shoulder normal-appearing Mildly tender to palpation acromioclavicular joint Range of motion intact external and internal rotation. Active abduction is limited to about 70 degrees.  Full passive motion with strongly positive drop arm sign. Arm strength is  significantly diminished with abduction 2/5. External rotation 4/5 with pain Internal rotation full 5/5 without pain Positive Hawkins and Neer's test.  Limited musculoskeletal ultrasound of the anterior superior shoulder reveals absence of the supraspinatus tendon at the anterior aspect of the shoulder.  The either very superior portion of the infraspinatus are very inferior portion of the supraspinatus is intact with hyperechoic changes his calcifications distally.  Bony structures are otherwise normal.  No results found for this or any previous visit (from the past 24 hour(s)). Dg Shoulder Left  Result Date: 12/28/2017 CLINICAL DATA:  62 year old female rolled over in bed a few weeks ago and heard pop. Left shoulder pain since. Initial encounter. EXAM: LEFT SHOULDER - 2+ VIEW COMPARISON:  None. FINDINGS: No fracture or dislocation. Mild acromioclavicular joint degenerative changes. Minimal spur greater tuberosity. No abnormal soft tissue calcifications. Visualized lungs clear. IMPRESSION: No fracture or dislocation. Mild acromioclavicular joint degenerative changes. Minimal spur greater tuberosity level. Electronically Signed   By: Lacy Duverney M.D.   On: 12/28/2017 17:49  I personally (independently) visualized and performed the interpretation of the images attached in this note.   Impression and Recommendations:    Assessment and Plan: 62 y.o. female with likely full-thickness rupture of the supraspinatus tendon.  I suspect she did this when she rolled over in bed about a month ago.  We discussed options.  She is profoundly weak and it has been about a month since her injury.  She is at high risk for atrophy of the supraspinatus tendon making repair very difficult.  Plan for MRI now to evaluate for retraction and fat atrophy as well as likely infraspinatus tendinitis.  We will go  ahead and start physical therapy as well.  Recheck after MRI to discuss her treatment options.  Surgery versus  conservative physical therapy.   Orders Placed This Encounter  Procedures  . DG Shoulder Left    Standing Status:   Future    Number of Occurrences:   1    Standing Expiration Date:   02/28/2019    Order Specific Question:   Reason for Exam (SYMPTOM  OR DIAGNOSIS REQUIRED)    Answer:   eval left shoulder pain suspect sup spin rupture    Order Specific Question:   Is patient pregnant?    Answer:   No    Order Specific Question:   Preferred imaging location?    [facility]    Order Specific Question:   Radiology Contrast Protocol - do NOT remove file path    Answer:   \\[email or web address].pdf  . MR Shoulder Left Wo Contrast    Standing Status:   Future    Standing Expiration Date:   02/28/2019    Order Specific Question:   What is the patient's sedation requirement?    Answer:   No Sedation    Order Specific Question:   Does the patient have a pacemaker or implanted devices?    Answer:   No    Order Specific Question:   Preferred imaging location?    Answer:   [facility] (table limit-[facility]lbs)    Order Specific Question:   Radiology Contrast Protocol - do NOT remove file path    Answer:   \\[email or web address]\[email or web address]\[email or web address]    Order Specific Question:   Is patient pregnant?    Answer:   No  . Ambulatory referral to Physical Therapy    Referral Priority:   Routine    Referral Type:   Physical Medicine    Referral Reason:   Specialty Services Required    Requested Specialty:   Physical Therapy   No orders of the defined types were placed in this encounter.   Discussed warning signs or symptoms. Please see discharge instructions. Patient expresses understanding.

## 2017-12-28 NOTE — Patient Instructions (Signed)
Thank you for coming in today. Get xray now.  You should hear about MRI.  Start PT.  Recheck with me after MRI.    Rotator Cuff Injury Rotator cuff injury is any type of injury to the set of muscles and tendons that make up the stabilizing unit of your shoulder. This unit holds the ball of your upper arm bone (humerus) in the socket of your shoulder blade (scapula). What are the causes? Injuries to your rotator cuff most commonly come from sports or activities that cause your arm to be moved repeatedly over your head. Examples of this include throwing, weight lifting, swimming, or racquet sports. Long lasting (chronic) irritation of your rotator cuff can cause soreness and swelling (inflammation), bursitis, and eventual damage to your tendons, such as a tear (rupture). What are the signs or symptoms? Acute rotator cuff tear:  Sudden tearing sensation followed by severe pain shooting from your upper shoulder down your arm toward your elbow.  Decreased range of motion of your shoulder because of pain and muscle spasm.  Severe pain.  Inability to raise your arm out to the side because of pain and loss of muscle power (large tears).  Chronic rotator cuff tear:  Pain that usually is worse at night and may interfere with sleep.  Gradual weakness and decreased shoulder motion as the pain worsens.  Decreased range of motion.  Rotator cuff tendinitis:  Deep ache in your shoulder and the outside upper arm over your shoulder.  Pain that comes on gradually and becomes worse when lifting your arm to the side or turning it inward.  How is this diagnosed? Rotator cuff injury is diagnosed through a medical history, physical exam, and imaging exam. The medical history helps determine the type of rotator cuff injury. Your health care provider will look at your injured shoulder, feel the injured area, and ask you to move your shoulder in different positions. X-ray exams typically are done to rule  out other causes of shoulder pain, such as fractures. MRI is the exam of choice for the most severe shoulder injuries because the images show muscles and tendons. How is this treated? Chronic tear:  Medicine for pain, such as acetaminophen or ibuprofen.  Physical therapy and range-of-motion exercises may be helpful in maintaining shoulder function and strength.  Steroid injections into your shoulder joint.  Surgical repair of the rotator cuff if the injury does not heal with noninvasive treatment.  Acute tear:  Anti-inflammatory medicines such as ibuprofen and naproxen to help reduce pain and swelling.  A sling to help support your arm and rest your rotator cuff muscles. Long-term use of a sling is not advised. It may cause significant stiffening of the shoulder joint.  Surgery may be considered within a few weeks, especially in younger, active people, to return the shoulder to full function.  Indications for surgical treatment include the following: ? Age younger than 60 years. ? Rotator cuff tears that are complete. ? Physical therapy, rest, and anti-inflammatory medicines have been used for 6-8 weeks, with no improvement. ? Employment or sporting activity that requires constant shoulder use.  Tendinitis:  Anti-inflammatory medicines such as ibuprofen and naproxen to help reduce pain and swelling.  A sling to help support your arm and rest your rotator cuff muscles. Long-term use of a sling is not advised. It may cause significant stiffening of the shoulder joint.  Severe tendinitis may require: ? Steroid injections into your shoulder joint. ? Physical therapy. ? Surgery.  Follow  these instructions at home:  Apply ice to your injury: ? Put ice in a plastic bag. ? Place a towel between your skin and the bag. ? Leave the ice on for 20 minutes, 2-3 times a day.  If you have a shoulder immobilizer (sling and straps), wear it until told otherwise by your health care  provider.  You may want to sleep on several pillows or in a recliner at night to lessen swelling and pain.  Only take over-the-counter or prescription medicines for pain, discomfort, or fever as directed by your health care provider.  Do simple hand squeezing exercises with a soft rubber ball to decrease hand swelling. Contact a health care provider if:  Your shoulder pain increases, or new pain or numbness develops in your arm, hand, or fingers.  Your hand or fingers are colder than your other hand. Get help right away if:  Your arm, hand, or fingers are numb or tingling.  Your arm, hand, or fingers are increasingly swollen and painful, or they turn white or blue. This information is not intended to replace advice given to you by your health care provider. Make sure you discuss any questions you have with your health care provider. Document Released: 09/25/2000 Document Revised: 03/05/2016 Document Reviewed: 05/10/2013 Elsevier Interactive Patient Education  2018 [employer]

## 2018-01-03 ENCOUNTER — Ambulatory Visit (INDEPENDENT_AMBULATORY_CARE_PROVIDER_SITE_OTHER): Payer: Managed Care, Other (non HMO)

## 2018-01-03 DIAGNOSIS — M25512 Pain in left shoulder: Secondary | ICD-10-CM

## 2018-01-03 DIAGNOSIS — M75122 Complete rotator cuff tear or rupture of left shoulder, not specified as traumatic: Secondary | ICD-10-CM

## 2018-01-04 ENCOUNTER — Ambulatory Visit: Payer: Managed Care, Other (non HMO) | Admitting: Physical Therapy

## 2018-01-04 ENCOUNTER — Encounter: Payer: Self-pay | Admitting: Physical Therapy

## 2018-01-04 DIAGNOSIS — M25612 Stiffness of left shoulder, not elsewhere classified: Secondary | ICD-10-CM | POA: Diagnosis not present

## 2018-01-04 DIAGNOSIS — M25512 Pain in left shoulder: Secondary | ICD-10-CM

## 2018-01-04 DIAGNOSIS — M6281 Muscle weakness (generalized): Secondary | ICD-10-CM

## 2018-01-04 NOTE — Patient Instructions (Addendum)
ROM: Flexion - Wand (Supine)    Lie on back holding wand. Raise arms over head. Start with pressing up, then move to going over head.  Repeat ___10 times per set. Do __2__ sets per session. Do _2-3___ sessions per day.  ROM: Extension - Wand (Standing)    Stand holding wand behind back. Raise arms as far as possible. Repeat _10___ times per set. Do __2__ sets per session. Do _2-3___ sessions per day.   ROM: External / Internal Rotation - Wand    Holding wand with left hand palm up, push out from body with other hand, palm down. Keep both elbows bent. When stretch is felt, hold _1___ seconds. Repeat to other side, leading with same hand. Keep elbows bent. Repeat _10___ times per set. Do __2__ sets per session. Do __2-3__ sessions per day.  ROM: Pendulum (Circular)    Let right arm move in circle clockwise, then counterclockwise, by rocking body weight in circular pattern. Circle __10__ times each direction per set. Do __1__ sets per session. Do __4-5__ sessions per day.  Scapular Retraction (Standing)    With arms at sides, pinch shoulder blades together. Repeat __10__ times per set. Do __2__ sets per session. Do __2-3__ sessions per day.  Shoulder Shrug    Bring shoulders up toward ears. Hold __1__ seconds. Relax. Repeat __10__ times. Do __2-3__ sessions per day.

## 2018-01-04 NOTE — Therapy (Addendum)
[facility] [place], [place], [address] Phone: [phone]   Fax:  [phone]  Physical Therapy Evaluation  Patient Details  Name: Amrit Erck MRN: [id number] Date of Birth: 1956-10-14 Referring Provider: Dr Lynne Leader   Encounter Date: 01/04/2018  PT End of Session - 01/04/18 1112    Visit Number  1    Number of Visits  4    Date for PT Re-Evaluation  03/01/18    PT Start Time  1107    PT Stop Time  1152    PT Time Calculation (min)  45 min    Activity Tolerance  Patient tolerated treatment well       Past Medical History:  Diagnosis Date  . Arthritis   . Asthma   . Diabetes mellitus without complication ([place])   . Hypertension   . IFG (impaired fasting glucose)     Past Surgical History:  Procedure Laterality Date  . CARDIAC SURGERY    . CESAREAN SECTION    . EYE SURGERY    . HERNIA REPAIR      There were no vitals filed for this visit.   Subjective Assessment - 01/04/18 1108    Subjective  Pt reports she rolled over on her left side and heard popping in her shoulder, no pain about 5-6 wks ago.  The next day she was not able to use her Lt arm.  She tried heat and continued with her mobic hoping it would get better.  She has tried some gentle exercises and wasn't geting better so she came to sports med MD.   Going to see an ortho MD and discuss options.     Diagnostic tests  MRI - complete tear of supraspinatus/infraspinatus    Patient Stated Goals  improve strength in the Lt UE and Rt,          [facility] PT Assessment - 01/04/18 0001      Assessment   Medical Diagnosis  Lt RTC tear    Referring Provider  Dr Lynne Leader    Onset Date/Surgical Date  11/23/17    Hand Dominance  Right    Next MD Visit  scheduling    Prior Therapy  none      Precautions   Precautions  None      Balance Screen   Has the patient fallen in the past 6 months  Yes    How many times?  1 a few weeks  ago, dark and raining      Prior Function   Level of Independence  Independent    Vocation  Retired Marine scientist    Leisure  do things with kids, sailboat, sewing knitting, cross stitch      Observation/Other Assessments   Focus on Therapeutic Outcomes (FOTO)   62% limited      Posture/Postural Control   Posture/Postural Control  Postural limitations    Postural Limitations  Rounded Shoulders;Forward head obesity      ROM / Strength   AROM / PROM / Strength  AROM;Strength;PROM      AROM   AROM Assessment Site  Shoulder;Cervical;Elbow;Wrist    Right/Left Shoulder  Left Rt WNL    Left Shoulder Extension  37 Degrees    Left Shoulder Flexion  21 Degrees    Left Shoulder ABduction  35 Degrees    Left Shoulder Internal Rotation  28 Degrees    Left Shoulder External Rotation  45 Degrees    Right/Left  Elbow  -- WNL    Right/Left Wrist  -- WNL    Cervical Flexion  WNL    Cervical Extension  WNL    Cervical - Right Side Bend  WNL some pulling in left side    Cervical - Left Side Bend  WNL    Cervical - Right Rotation  WNL    Cervical - Left Rotation  WNL      PROM   PROM Assessment Site  Shoulder    Right/Left Shoulder  Left    Left Shoulder Flexion  130 Degrees    Left Shoulder ABduction  50 Degrees    Left Shoulder External Rotation  38 Degrees in supine      Strength   Strength Assessment Site  Shoulder    Right/Left Shoulder  Left    Left Shoulder Flexion  3-/5    Left Shoulder Extension  3/5    Left Shoulder ABduction  3-/5    Left Shoulder Internal Rotation  4+/5    Left Shoulder External Rotation  2+/5              No data recorded  Objective measurements completed on examination: See above findings.      [facility] Adult PT Treatment/Exercise - 01/04/18 0001      Exercises   Exercises  Shoulder      Shoulder Exercises: Supine   External Rotation  AAROM;Left    Internal Rotation  AAROM;Left    Flexion  AAROM;Left      Shoulder Exercises: Standing   Other  Standing Exercises  shoulder shrugs and retraction      Shoulder Exercises: ROM/Strengthening   Pendulum  Lt VC for form             PT Education - 01/04/18 1146    Education provided  Yes    Education Details  HEP    Person(s) Educated  Patient    Methods  Explanation;Demonstration;Handout    Comprehension  Returned demonstration;Verbalized understanding          PT Long Term Goals - 01/04/18 1302      PT LONG TERM GOAL #1   Title  I with HEP ( 03/01/18)     Time  8    Period  Weeks    Status  New      PT LONG TERM GOAL #2   Title  improve Lt shoulder PROM to WNL ( 03/01/18)     Time  8    Period  Weeks    Status  New      PT LONG TERM GOAL #3   Title  donn/doff glasses without compensatory Lt shoulder motions ( 03/01/18)     Time  8    Period  Weeks    Status  New      PT LONG TERM GOAL #4   Title  improve FOTO =/> 50% limited ( 03/01/18)     Time  8    Period  Weeks    Status  New      PT LONG TERM GOAL #5   Title  to be determined based on surgical options.              Plan - 01/04/18 1259    Clinical Impression Statement  62 yo female with complete tears in her Lt infraspinatus and supraspinatus.  She has limited A/PROM in the Lt shoulder, elbow/wrist WNL,  Pt is beginning to develop some compensatory motions with her Lt  UE and has a fair amount of guarding.  She is going to have a consult with an ortho surgeon to discuss options.  We will work to conserve her PT visits incase she needs them for after surgery     Clinical Presentation  Evolving    Clinical Decision Making  Moderate    Rehab Potential  Good    PT Frequency  Biweekly    PT Duration  8 weeks    PT Treatment/Interventions  ADLs/Self Care Home Management;Dry needling;Manual techniques;Moist Heat;Taping;Vasopneumatic Device;Therapeutic exercise;Cryotherapy;Electrical Stimulation;Passive range of motion    PT Next Visit Plan  PROM Lt shoulder/AAROM, scapular stability.     Consulted  and Agree with Plan of Care  Patient       Patient will benefit from skilled therapeutic intervention in order to improve the following deficits and impairments:  Pain, Postural dysfunction, Increased muscle spasms, Impaired UE functional use, Decreased range of motion  Visit Diagnosis: Stiffness of left shoulder, not elsewhere classified - Plan: PT plan of care cert/re-cert  Muscle weakness (generalized) - Plan: PT plan of care cert/re-cert  Acute pain of left shoulder - Plan: PT plan of care cert/re-cert     Problem List Patient Active Problem List   Diagnosis Date Noted  . Palpitations 03/04/2017  . DIABETES MELLITUS, TYPE II 09/19/2007  . HYPERTENSION 09/19/2007    Jeral Pinch PT  01/04/2018, 1:08 PM  [facility] [place], [place], [address] Phone: [phone]   Fax:  [phone]  Name: Anjelica Gorniak MRN: [id number] Date of Birth: [birth year]   PHYSICAL THERAPY DISCHARGE SUMMARY  Visits from Start of Care: 1  Current functional level related to goals / functional outcomes: Unknown current function, she is going to have shoulder surgery   Remaining deficits: unknown   Education / Equipment: HEP Plan: Patient agrees to discharge.  Patient goals were not met. Patient is being discharged due to a change in medical status.  ?????     Jeral Pinch, PT 02/22/18 9:40 AM

## 2018-01-05 ENCOUNTER — Ambulatory Visit: Payer: Managed Care, Other (non HMO) | Admitting: Family Medicine

## 2018-01-05 ENCOUNTER — Encounter: Payer: Self-pay | Admitting: Family Medicine

## 2018-01-05 DIAGNOSIS — M75120 Complete rotator cuff tear or rupture of unspecified shoulder, not specified as traumatic: Secondary | ICD-10-CM | POA: Insufficient documentation

## 2018-01-05 DIAGNOSIS — M75122 Complete rotator cuff tear or rupture of left shoulder, not specified as traumatic: Secondary | ICD-10-CM | POA: Diagnosis not present

## 2018-01-05 NOTE — Progress Notes (Signed)
Terri Nicholson is a 62 y.o. female who presents to [facility] today for left rotator cuff tear follow-up.  Terri Nicholson was seen on March 19 for left shoulder pain.  At that time she was thought to have a complete rupture of her supraspinatus tendon based on ultrasound appearance.  Based on her history of feeling a pop while rolling over in bed we suspected that the injury happened about mid February.  She had an MRI showing complete rupture and retraction of the supraspinatus and a full-thickness near-complete with tear and retraction of the infraspinatus tendon.  The MRI showed mild atrophy of the supraspinatus and infraspinatus muscles.  She is done some thinking and is interested in potential surgical repair of this.  She notes difficulty with overhead motion.  She had one episode of physical therapy and has increased range of motion.   Past Medical History:  Diagnosis Date  . Arthritis   . Asthma   . Diabetes mellitus without complication (HCC)   . Hypertension   . IFG (impaired fasting glucose)    Past Surgical History:  Procedure Laterality Date  . CARDIAC SURGERY    . CESAREAN SECTION    . EYE SURGERY    . HERNIA REPAIR     Social History   Tobacco Use  . Smoking status: Never Smoker  . Smokeless tobacco: Never Used  Substance Use Topics  . Alcohol use: No     ROS:  As above   Medications: Current Outpatient Medications  Medication Sig Dispense Refill  . albuterol (PROVENTIL HFA;VENTOLIN HFA) 108 (90 BASE) MCG/ACT inhaler Inhale 2 puffs into the lungs every 6 (six) hours as needed.    . Biotin 1 MG CAPS Take by mouth.    . budesonide-formoterol (SYMBICORT) 160-4.5 MCG/ACT inhaler Inhale 2 puffs into the lungs 2 (two) times daily.    . carbamide peroxide (DEBROX) 6.5 % otic solution Place 5 drops into both ears 2 (two) times daily. 15 mL 0  . citalopram (CELEXA) 20 MG tablet Take 20 mg by mouth daily.    . Exenatide  ER (BYDUREON) 2 MG PEN Inject into the skin.    . fish oil-omega-3 fatty acids 1000 MG capsule Take 2 g by mouth daily.    Marland Kitchen. losartan (COZAAR) 50 MG tablet Take 50 mg by mouth daily.    . meloxicam (MOBIC) 15 MG tablet Take 15 mg by mouth daily.    . Prenatal Vit-Fe Fumarate-FA (PRENATAL MULTIVITAMIN) TABS Take 1 tablet by mouth daily.    Marland Kitchen. triamterene-hydrochlorothiazide (MAXZIDE-25) 37.5-25 MG per tablet Take 1 tablet by mouth daily.     No current facility-administered medications for this visit.    No Known Allergies   Exam:  BP 126/86   Pulse 92   Ht 5' (1.524 m)   Wt 223 lb (101.2 kg)   BMI 43.55 kg/m  General: Well Developed, well nourished, and in no acute distress.  Neuro/Psych: Alert and oriented x3, extra-ocular muscles intact, able to move all 4 extremities, sensation grossly intact. Skin: Warm and dry, no rashes noted.  Respiratory: Not using accessory muscles, speaking in full sentences, trachea midline.  Cardiovascular: Pulses palpable, no extremity edema. Abdomen: Does not appear distended. MSK:  Left shoulder: Nontender decreased range of motion especially active abduction and external rotation. Strength is diminished abduction and external rotation.     No results found for this or any previous visit (from the past 48 hour(s)). Mr Shoulder  Left Wo Contrast  Result Date: 01/03/2018 CLINICAL DATA:  Chronic left shoulder pain and decreased range of motion for the past few months. No acute injury. EXAM: MRI OF THE LEFT SHOULDER WITHOUT CONTRAST TECHNIQUE: Multiplanar, multisequence MR imaging of the shoulder was performed. No intravenous contrast was administered. COMPARISON:  Left shoulder x-rays dated December 28, 2017. FINDINGS: Rotator cuff: Full-thickness, full width tear of the supraspinatus tendon with approximately 2 cm of retraction to the mid humeral head. Full-thickness, near full width tear of the infraspinatus tendon with approximately 1.7 cm of retraction  to the mid humeral head. The subscapularis and teres minor tendons are unremarkable. Muscles: Mild fatty atrophy of the supraspinatus and infraspinatus muscles. No muscle edema. Biceps long head:  Intact and normally positioned. Acromioclavicular Joint: Moderate arthropathy of the acromioclavicular joint. Type III acromion. Small amount of subacromial/subdeltoid bursal fluid. Glenohumeral Joint: Small joint effusion.  No focal chondral defect. Labrum:  Blunting of the superior and posterosuperior labrum. Bones:  No fracture dislocation. Other: None. IMPRESSION: 1. Full-thickness, full width tear of the supraspinatus tendon with 2 cm retraction. Mild supraspinatus muscle atrophy. 2. Full-thickness, near full width tear of the infraspinatus tendon with 1.7 cm retraction. Mild infraspinatus muscle atrophy. 3. Moderate acromioclavicular osteoarthritis. 4. Blunting of the superior and posterosuperior labrum may reflect degenerative tearing. 5. Small glenohumeral joint effusion. Electronically Signed   By: Obie Dredge M.D.   On: 01/03/2018 15:14      Assessment and Plan: 62 y.o. female with  Left shoulder injury with complete retracted tear of the supraspinatus and near complete tear of the infraspinatus.  Patient has significant overhead weakness and started to have atrophy of the musculature as noted on MRI.  We discussed that if she would choose to have surgical repair of this injury needs to be very soon.  [facility].  If she cannot get in in a timely manner she will let me know and I will try to get her in sooner as time is of the essence for this repair.  Advised her to discontinue physical therapy at this time as she will need those visits for the postsurgical recovery.   Recheck with me as needed.    Orders Placed This Encounter  Procedures  . Ambulatory referral to Orthopedic Surgery    Referral Priority:   Routine    Referral Type:   Surgical     Referral Reason:   Specialty Services Required    Referred to Provider:   Jones Broom, MD    Requested Specialty:   Orthopedic Surgery    Number of Visits Requested:   1   No orders of the defined types were placed in this encounter.   Discussed warning signs or symptoms. Please see discharge instructions. Patient expresses understanding.  I spent 15 minutes with this patient, greater than 50% was face-to-face time counseling regarding ddx and treatment plan.

## 2018-01-05 NOTE — Patient Instructions (Addendum)
You should hear from Dr Veda Canning office soon.  If you cannot get in to see him in a timely manner let me know.  You do not HAVE  To have surgery for this but you will have better strength outcomes with surgery.   Recheck with me as needed.     Surgery for Rotator Cuff Tear, Care After Refer to this sheet in the next few weeks. These instructions provide you with information about caring for yourself after your procedure. Your health care provider may also give you more specific instructions. Your treatment has been planned according to current medical practices, but problems sometimes occur. Call your health care provider if you have any problems or questions after your procedure. What can I expect after the procedure? After the procedure, it is common to have:  Swelling.  Pain.  Stiffness.  Tenderness.  Follow these instructions at home: If you have a sling:  Wear the sling as told by your health care provider. Remove it only as told by your health care provider.  Loosen the sling if your fingers tingle, become numb, or turn cold and blue.  Do not let your sling get wet if it is not waterproof.  Keep the sling clean. Bathing  Do not take baths, swim, or use a hot tub until your health care provider approves. Ask your health care provider if you can take showers. You may only be allowed to take sponge baths for bathing.  Keep your bandage (dressing) dry until your health care provider says it can be removed. Incision care  Follow instructions from your health care provider about how to take care of your incision. Make sure you: ? Wash your hands with soap and water before you change your dressing. If soap and water are not available, use hand sanitizer. ? Change your dressing as told by your health care provider. ? Leave stitches (sutures), skin glue, or adhesive strips in place. These skin closures may need to stay in place for 2 weeks or longer. If adhesive strip edges  start to loosen and curl up, you may trim the loose edges. Do not remove adhesive strips completely unless your health care provider tells you to do that.  Check your incision area every day for signs of infection. Check for: ? More redness, swelling, or pain. ? More fluid or blood. ? Warmth. ? Pus or a bad smell. Managing pain, stiffness, and swelling   If directed, put ice on your shoulder area. ? Put ice in a plastic bag. ? Place a towel between your skin and the bag. ? Leave the ice on for 20 minutes, 2-3 times a day.  Move your fingers often to avoid stiffness and to lessen swelling.  Raise (elevate) your upper body on pillows when you lie down and when you sleep. ? Do not sleep on the front of your body (abdomen). ? Do not sleep on the side that your surgery was performed on. Driving  Do not drive for 24 hours if you received a medicine to help you relax (sedative) during your procedure.  Do not drive or operate heavy machinery while taking prescription pain medicine.  Ask your health care provider when it is safe for you to drive. Activity  Do not use your arm to support your body weight until your health care provider approves.  Do not lift or hold anything with your arm until your health care provider approves.  Return to your normal activities as told by your  health care provider. Ask your health care provider what activities are safe for you.  Do exercises as told by your health care provider. General instructions   Do not use any tobacco products, such as cigarettes, chewing tobacco, or e-cigarettes. Tobacco can delay healing. If you need help quitting, ask your health care provider.  Take over-the-counter and prescription medicines only as told by your health care provider.  If you were prescribed an antibiotic medicine, take it as told by your health care provider. Do not stop taking the antibiotic even if you start to feel better.  Keep all follow-up visits  as told by your health care provider. This is important. Contact a health care provider if:  You have a fever.  You have more redness, swelling, or pain around your incision.  You have more fluid or blood coming from your incision.  Your incision feels warm to the touch.  You have pus or a bad smell coming from your incision.  You have pain that gets worse or does not get better with medicine. Get help right away if:  You have severe pain.  You lose feeling in your arm or hand.  Your hand or fingers turn very pale or blue. This information is not intended to replace advice given to you by your health care provider. Make sure you discuss any questions you have with your health care provider. Document Released: 09/28/2005 Document Revised: 06/03/2016 Document Reviewed: 10/12/2015 Elsevier Interactive Patient Education  [employer]2018 [employer]

## 2018-03-10 ENCOUNTER — Encounter: Payer: Self-pay | Admitting: Rehabilitative and Restorative Service Providers"

## 2018-03-10 ENCOUNTER — Ambulatory Visit (INDEPENDENT_AMBULATORY_CARE_PROVIDER_SITE_OTHER): Payer: Managed Care, Other (non HMO) | Admitting: Rehabilitative and Restorative Service Providers"

## 2018-03-10 DIAGNOSIS — R293 Abnormal posture: Secondary | ICD-10-CM

## 2018-03-10 DIAGNOSIS — R29898 Other symptoms and signs involving the musculoskeletal system: Secondary | ICD-10-CM | POA: Diagnosis not present

## 2018-03-10 DIAGNOSIS — M6281 Muscle weakness (generalized): Secondary | ICD-10-CM

## 2018-03-10 DIAGNOSIS — M25512 Pain in left shoulder: Secondary | ICD-10-CM | POA: Diagnosis not present

## 2018-03-10 DIAGNOSIS — M25612 Stiffness of left shoulder, not elsewhere classified: Secondary | ICD-10-CM

## 2018-03-10 NOTE — Patient Instructions (Addendum)
Side Bend, Sitting    Sit, head in comfortable, centered position, chin slightly tucked. Gently tilt head, bringing ear toward same-side shoulder. Hold _10-15__ seconds.  Repeat _3-5__ times per session. Do _2-3__ sessions per day.    AROM, Rotation    Sit or stand, head comfortable, centered position. Turn head slowly to look over one shoulder. Hold __5-10_ seconds. Repeat to other side. Repeat _3-5__ times per session. Do _2-3__ sessions per day.   Axial Extension (Chin Tuck)    Pull chin in and lengthen back of neck. Hold __5__ seconds while counting out loud. Repeat __10__ times. Do __several__ sessions per day.  Shoulder Blade Squeeze    Rotate shoulders back, then squeeze shoulder blades down and back  Hold 10 sec Repeat __10__ times. Do __several __ sessions per day.  External rotation stretch(using broom)    Arms at waist height,left thumb up. Use cane with right hand to push left hand to the side to 20 degrees Hold for __10__ seconds. Repeat _5-10___ times. 2-3 times per day.   Pendulum Circular    Bend forward 90 at waist, leaning on table for support. Rock body in a circular pattern to move arm clockwise __30__ times then counterclockwise __30__ times. Do _several___ sessions per day as needed.   External Rotator Cuff Stretch, Sitting (Passive)    Sit with elbow bent, forearm on table, palm down. Bend forward at waist until a stretch is felt in shoulder. Hold __10_ seconds.  Repeat _10__ times per session. Do __3_ sessions per day.  TENS UNIT: This is helpful for muscle pain and spasm.   Search and Purchase a TENS 7000 2nd edition at www.tenspros.com. It should be less than $30.     TENS unit instructions: Do not shower or bathe with the unit on Turn the unit off before removing electrodes or batteries If the electrodes lose stickiness add a drop of water to the electrodes after they are disconnected from the unit and place on plastic sheet.  If you continued to have difficulty, call the TENS unit company to purchase more electrodes. Do not apply lotion on the skin area prior to use. Make sure the skin is clean and dry as this will help prolong the life of the electrodes. After use, always check skin for unusual red areas, rash or other skin difficulties. If there are any skin problems, does not apply electrodes to the same area. Never remove the electrodes from the unit by pulling the wires. Do not use the TENS unit or electrodes other than as directed. Do not change electrode placement without consultating your therapist or physician. Keep 2 fingers with between each electrode.     [facility] at [facility] [address] [place], [place] [address]  [phone] (office) [phone] (fax)

## 2018-03-10 NOTE — Therapy (Signed)
[facility] [place] [address] [place], [place], [address] Phone: [phone]   Fax:  [phone]  Physical Therapy Evaluation  Patient Details  Name: Terri Nicholson MRN: [id number] Date of Birth: 01/25/1956 Referring Provider: Dr Jones Broom    Encounter Date: 03/10/2018  PT End of Session - 03/10/18 1145    Visit Number  1    Number of Visits  20    Date for PT Re-Evaluation  06/02/18    PT Start Time  1027    PT Stop Time  1140    PT Time Calculation (min)  73 min    Activity Tolerance  Patient tolerated treatment well       Past Medical History:  Diagnosis Date  . Arthritis   . Asthma   . Diabetes mellitus without complication (HCC)   . Hypertension   . IFG (impaired fasting glucose)     Past Surgical History:  Procedure Laterality Date  . CARDIAC SURGERY    . CESAREAN SECTION    . EYE SURGERY    . HERNIA REPAIR      There were no vitals filed for this visit.   Subjective Assessment - 03/10/18 1025    Subjective  Patient reports that she felt a pop in her shoulder mid February when she rolled over in bed. she has had continued pain since that time. She was treated with medications and exercises. Symptoms persisted with US showing damage to RC tendons. MRI showed complete/partial tears of spuraspinitus and infraspinitus. She underwent RCR 02/22/18     Pertinent History  pain in the Rt shoulder; surgeries; AODM     Diagnostic tests  MRI - tear supraspinitus; infraspinitus; labral tear     Patient Stated Goals  increase ROM in Lt shoulder without pain - use arm again normally     Currently in Pain?  Yes    Pain Score  3     Pain Location  Shoulder    Pain Orientation  Right;Anterior    Pain Descriptors / Indicators  Aching    Pain Type  Chronic pain    Pain Radiating Towards  along clavical and neck     Pain Onset  More than a month ago    Pain Frequency  Constant    Aggravating Factors   movement;  prolonged postures; showering; holding a book; any lifting even light     Pain Relieving Factors  ice compression          [facility] PT Assessment - 03/10/18 0001      Assessment   Medical Diagnosis  Lt RCR - supranitus; infraspinitus; labral repair     Referring Provider  Dr Jones Broom     Onset Date/Surgical Date  02/22/18 injury 11/26/17    Hand Dominance  Right    Next MD Visit  04/06/18    Prior Therapy  one visit prior to surgery - end of March      Precautions   Precaution Comments  post op precautions       Balance Screen   Has the patient fallen in the past 6 months  Yes    How many times?  2    Has the patient had a decrease in activity level because of a fear of falling?   No    Is the patient reluctant to leave their home because of a fear of falling?   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     Leisure  household chores; walking 3-4 days/week 45 min; crafts - knitting/corss stitch; sewing; cooking; water aerobics       Observation/Other Assessments   Focus on Therapeutic Outcomes (FOTO)   96% limitation       Sensation   Additional Comments  WFL's per pt report       Posture/Postural Control   Posture Comments  head forward; shoudlers rounded and elevated; head of the humerus anterior in orientation; scapulae abducted and rotated along the thoracic wall       AROM   Right/Left Shoulder  -- AROM Lt UE not tested due to sx     Right Shoulder Extension  55 Degrees    Right Shoulder Flexion  153 Degrees    Right Shoulder ABduction  157 Degrees    Right Shoulder Internal Rotation  30 Degrees    Right Shoulder External Rotation  70 Degrees      Strength   Overall Strength Comments  grossly 4/5 Rt UE       Palpation   Palpation comment  muscular tightness through pecs; upper trap; leveator; deltoid; biceps Lt > Rt                  Objective measurements completed on examination: See above findings.      [facility] Adult PT Treatment/Exercise - 03/10/18 0001      Self-Care   Self-Care  -- sitting w/ Lt UE supported on pillow 30 d abd; lying in bed      Neuro Re-ed    Neuro Re-ed Details   initiated postural correction       Shoulder Exercises: Standing   Other Standing Exercises  axial extension 10 sec x 5; lateral cervical flexion 10 sec x 2; cervical rotation 5 sec x 2; scp squeeze 10 sec x 10 with swim noodle       Shoulder Exercises: ROM/Strengthening   Pendulum  30 CW/30 CCW      Shoulder Exercises: Stretch   External Rotation Stretch  5 reps;10 seconds standing ER w/ cane elbow at side     Table Stretch - Flexion  5 reps;10 seconds      Electrical Stimulation   Electrical Stimulation Location  Lt shoulder     Electrical Stimulation Action  TENS unit     Electrical Stimulation Parameters  to tolerance    Electrical Stimulation Goals  Pain;Tone      Vasopneumatic   Number Minutes Vasopneumatic   15 minutes    Vasopnuematic Location   Shoulder Lt    Vasopneumatic Pressure  Low    Vasopneumatic Temperature   34 deg              PT Education - 03/10/18 1126    Education provided  Yes    Education Details  HEP TENS     Person(s) Educated  Patient    Methods  Explanation;Demonstration;Tactile cues;Verbal cues;Handout    Comprehension  Verbalized understanding;Returned demonstration;Verbal cues required;Tactile cues required       PT Short Term Goals - 03/10/18 1153      PT SHORT TERM GOAL #1   Title  Decrease pain Lt shoulder to 0/10 to 2/10 for rest and no more than 5/10 with exercise 04/21/18    Time  6  Period  Weeks    Status  New      PT SHORT TERM GOAL #2   Title  Improve posture and alignment with patient to demonstrate improved upright posture with posterior shoulder girdle engaged 04/21/18    Time  6    Period  Weeks    Status  New      PT SHORT TERM  GOAL #3   Title  Lt shoulder PROM to 90 deg flexion and 20 deg ER - progressing with ROM per protocol 04/21/18    Time  6    Period  Weeks        PT Long Term Goals - 03/10/18 1233      PT LONG TERM GOAL #1   Title  AROM Lt shoulder =/> AROM Rt shoulder 06/02/18    Time  12    Period  Weeks    Status  New      PT LONG TERM GOAL #2   Title  4-/5 to 4+/5 strength Lt shoulder 06/02/18    Time  12    Period  Weeks    Status  New      PT LONG TERM GOAL #3   Title  independent in HEP 06/02/18    Time  12    Period  Weeks    Status  New      PT LONG TERM GOAL #4   Title  improve FOTO =/> 48% limited 05/02/18    Time  12    Period  Weeks    Status  New      PT LONG TERM GOAL #5   Title  -             Plan - 03/10/18 1147    Clinical Impression Statement  Patient presents s/p Lt 2 tendon RCR with labral repain 02/22/18. She reports initial injury to Lt shoulder 11/26/17 with subsequent MRI showing tears of supraspinitus; infraspinitus and labrum. Patient presents to clinic with Lt UE supported in sling. She has poor posture and alignment; limited ROM and strength Lt UE; muscular tightness Lt upper quarter; limited functional activity level and pain on a constant basis. Patient will benefit from PT to address problems indentified.    History and Personal Factors relevant to plan of care:  AODM; complete tendon tear with resection of tendons prior to surgery; injury 2/19 with surgery 3 months after injury; poor posture and alignment, age, gender - all increase chance of adhesive capsulitis post surgery     Clinical Presentation  Evolving    Clinical Presentation due to:  above     Clinical Decision Making  Low    Rehab Potential  Good    Clinical Impairments Affecting Rehab Potential  see above     PT Frequency  2x / week    PT Duration  12 weeks    PT Treatment/Interventions  Patient/family education;ADLs/Self Care Home Management;Cryotherapy;Electrical Stimulation;Iontophoresis  /ml Dexamethasone;Moist Heat;Ultrasound;Dry needling;Manual techniques;Neuromuscular re-education;Therapeutic activities;Therapeutic exercise    PT Next Visit Plan  review HEP; add manual work and PROM Lt shoudler; continue with postural correction; progression of rehab per protocol     Consulted and Agree with Plan of Care  Patient       Patient will benefit from skilled therapeutic intervention in order to improve the following deficits and impairments:  Postural dysfunction, Improper body mechanics, Pain, Increased fascial restricitons, Increased muscle spasms, Decreased mobility, Decreased range of motion, Decreased strength, Decreased activity tolerance, Impaired UE  functional use  Visit Diagnosis: Acute pain of left shoulder - Plan: PT plan of care cert/re-cert  Muscle weakness (generalized) - Plan: PT plan of care cert/re-cert  Stiffness of left shoulder joint - Plan: PT plan of care cert/re-cert  Other symptoms and signs involving the musculoskeletal system - Plan: PT plan of care cert/re-cert  Abnormal posture - Plan: PT plan of care cert/re-cert     Problem List Patient Active Problem List   Diagnosis Date Noted  . Complete rotator cuff tear 01/05/2018  . Palpitations 03/04/2017  . DIABETES MELLITUS, TYPE II 09/19/2007  . HYPERTENSION 09/19/2007    Tzippy Testerman Rober Minion PT, MPH  03/10/2018, 12:40 PM  [facility] [address] [place], [place], [address] Phone: [phone]   Fax:  [phone]  Name: Cyril Railey MRN: [id number] Date of Birth: 1956/03/09

## 2018-03-14 ENCOUNTER — Encounter: Payer: Self-pay | Admitting: Rehabilitative and Restorative Service Providers"

## 2018-03-14 ENCOUNTER — Ambulatory Visit: Payer: Managed Care, Other (non HMO) | Admitting: Rehabilitative and Restorative Service Providers"

## 2018-03-14 DIAGNOSIS — M25512 Pain in left shoulder: Secondary | ICD-10-CM | POA: Diagnosis not present

## 2018-03-14 DIAGNOSIS — M6281 Muscle weakness (generalized): Secondary | ICD-10-CM

## 2018-03-14 DIAGNOSIS — R293 Abnormal posture: Secondary | ICD-10-CM

## 2018-03-14 DIAGNOSIS — R29898 Other symptoms and signs involving the musculoskeletal system: Secondary | ICD-10-CM

## 2018-03-14 DIAGNOSIS — M25612 Stiffness of left shoulder, not elsewhere classified: Secondary | ICD-10-CM | POA: Diagnosis not present

## 2018-03-14 NOTE — Therapy (Signed)
[facility] [address] [place], [place], [address] Phone: [phone]   Fax:  [phone]  Physical Therapy Treatment  Patient Details  Name: Terri MoraleMichele Anne Nicholson MRN: [id number] Date of Birth: Aug 02, 1956 Referring Provider: Dr Jones BroomJustin Chandler    Encounter Date: 03/14/2018  PT End of Session - 03/14/18 1510    Visit Number  2    Number of Visits  20    Date for PT Re-Evaluation  06/02/18    PT Start Time  1407    PT Stop Time  1509    PT Time Calculation (min)  62 min    Activity Tolerance  Patient tolerated treatment well       Past Medical History:  Diagnosis Date  . Arthritis   . Asthma   . Diabetes mellitus without complication (HCC)   . Hypertension   . IFG (impaired fasting glucose)     Past Surgical History:  Procedure Laterality Date  . CARDIAC SURGERY    . CESAREAN SECTION    . EYE SURGERY    . HERNIA REPAIR      There were no vitals filed for this visit.  Subjective Assessment - 03/14/18 1404    Subjective  Patient reports that she has been doing her exercises. Some pain with table slide.     Currently in Pain?  Yes    Pain Score  3  varies through the day with activities     Pain Location  Shoulder    Pain Orientation  Right;Anterior    Pain Type  Chronic pain;Surgical pain    Pain Radiating Towards  along clavicle and neck     Pain Onset  More than a month ago    Pain Frequency  Constant         [facility] PT Assessment - 03/14/18 0001      Assessment   Medical Diagnosis  Lt RCR - supraspinatus; infraspinatus; labral repair     Referring Provider  Dr Jones BroomJustin Chandler     Onset Date/Surgical Date  02/22/18 injury 11/26/17    Hand Dominance  Right    Next MD Visit  04/06/18    Prior Therapy  one visit prior to surgery - end of March      Precautions   Precaution Comments  post op precautions - see protocol       PROM   Overall PROM Comments  [facility]  extension and supination/pronation of forearm in sitting and standing     Right/Left Shoulder  -- Lt patient sitting - see protocol     Left Shoulder Flexion  90 Degrees    Left Shoulder ABduction  -- scaption ~ 70 deg     Left Shoulder External Rotation  20 Degrees      Palpation   Palpation comment  muscular tightness through pecs; upper trap; leveator; deltoid; biceps Lt > Rt                    [facility] Adult PT Treatment/Exercise - 03/14/18 0001      Therapeutic Activites    Therapeutic Activities  -- myofacial ball release work standing at wall 4 inch ball       Neuro Re-ed    Neuro Re-ed Details   working on posture and alignment in standing and sitting       Shoulder Exercises: Standing   Other Standing Exercises  axial extension 10 sec x  5; lateral cervical flexion 10 sec x 2; cervical rotation 5 sec x 2; scp squeeze 10 sec x 10 with swim noodle       Shoulder Exercises: ROM/Strengthening   Pendulum  30 CW/30 CCW      Shoulder Exercises: Stretch   External Rotation Stretch  5 reps;10 seconds standing ER w/ cane elbow at side     Table Stretch - Flexion  5 reps;10 seconds    Table Stretch -Flexion Limitations  standing step back hands on counter, 10 sec hold x 5       Electrical Stimulation   Electrical Stimulation Location  Lt shoulder     Electrical Stimulation Action  IFC    Electrical Stimulation Parameters  to tolerance    Electrical Stimulation Goals  Pain;Tone      Vasopneumatic   Number Minutes Vasopneumatic   15 minutes    Vasopnuematic Location   Shoulder Lt    Vasopneumatic Pressure  Low    Vasopneumatic Temperature   34 deg       Manual Therapy   Manual therapy comments  pt sitting    Joint Mobilization  Lt GH joint gently circumduction in sitting     Soft tissue mobilization  working through the pecs; upper trap; leveator; periscapular musculature; biceps; deltoid Lt shoulder     Myofascial Release  upper trap     Scapular Mobilization   scapular protraction     Passive ROM  PROM Lt shoulder              PT Education - 03/14/18 1502    Education provided  Yes    Education Details  HEP myofacial ball release work     Teacher, music) Educated  Patient    Methods  Explanation;Demonstration;Tactile cues;Verbal cues;Handout    Comprehension  Verbalized understanding;Returned demonstration;Verbal cues required;Tactile cues required       PT Short Term Goals - 03/14/18 1403      PT SHORT TERM GOAL #1   Title  Decrease pain Lt shoulder to 0/10 to 2/10 for rest and no more than 5/10 with exercise 04/21/18    Time  6    Period  Weeks    Status  On-going      PT SHORT TERM GOAL #2   Title  Improve posture and alignment with patient to demonstrate improved upright posture with posterior shoulder girdle engaged 04/21/18    Time  6    Period  Weeks    Status  On-going      PT SHORT TERM GOAL #3   Title  Lt shoulder PROM to 90 deg flexion and 20 deg ER - progressing with ROM per protocol 04/21/18    Time  6    Period  Weeks    Status  On-going        PT Long Term Goals - 03/14/18 1403      PT LONG TERM GOAL #1   Title  AROM Lt shoulder =/> AROM Rt shoulder 06/02/18    Time  12    Period  Weeks    Status  On-going      PT LONG TERM GOAL #2   Title  4-/5 to 4+/5 strength Lt shoulder 06/02/18    Time  12    Period  Weeks    Status  On-going      PT LONG TERM GOAL #3   Title  independent in HEP 06/02/18    Time  12  Period  Weeks    Status  On-going      PT LONG TERM GOAL #4   Title  improve FOTO =/> 48% limited 05/02/18    Time  12    Period  Weeks    Status  On-going            Plan - 03/14/18 1410    Clinical Impression Statement  Patient continues to hold arm in guarded position at side when not in sling. Engcouraged patient to relax UE at side, elbow extended. Tolerated exercise and manual work including PROM well. Good response to estim and vaso.     Rehab Potential  Good    PT Frequency  2x /  week    PT Duration  12 weeks    PT Treatment/Interventions  Patient/family education;ADLs/Self Care Home Management;Cryotherapy;Electrical Stimulation;Iontophoresis 4mg /ml Dexamethasone;Moist Heat;Ultrasound;Dry needling;Manual techniques;Neuromuscular re-education;Therapeutic activities;Therapeutic exercise    PT Next Visit Plan  review HEP; add manual work and PROM Lt shoudler; continue with postural correction; progression of rehab per protocol     Consulted and Agree with Plan of Care  Patient       Patient will benefit from skilled therapeutic intervention in order to improve the following deficits and impairments:  Postural dysfunction, Improper body mechanics, Pain, Increased fascial restricitons, Increased muscle spasms, Decreased mobility, Decreased range of motion, Decreased strength, Decreased activity tolerance, Impaired UE functional use  Visit Diagnosis: Acute pain of left shoulder  Muscle weakness (generalized)  Stiffness of left shoulder joint  Other symptoms and signs involving the musculoskeletal system  Abnormal posture  Stiffness of left shoulder, not elsewhere classified     Problem List Patient Active Problem List   Diagnosis Date Noted  . Complete rotator cuff tear 01/05/2018  . Palpitations 03/04/2017  . DIABETES MELLITUS, TYPE II 09/19/2007  . HYPERTENSION 09/19/2007    Celyn Rober Minion PT, MPH  03/14/2018, 3:12 PM  [facility] [address] [place], [place], [address] Phone: [phone]   Fax:  [phone]  Name: Nery Kalisz MRN: [id number] Date of Birth: 1955-12-10

## 2018-03-14 NOTE — Patient Instructions (Addendum)
Self massage using ~4 inch plastic ball   Standing - allow the elbow to relax and your arm to come down by your side  Turn forward and back   Flexors Stretch, Standing you will not bend your legs down so far - just to feel that stretch in your shoulder - no more than 90 deg bend in the shoulder until after 03/29/18    Stand about a thigh's length from table. Grip edges of table. Bend knees until stretch is felt under shoulder blades in chest. Hold _5-10__ seconds. Repeat _3-5__ times per session. Do __2-3_ sessions per day.

## 2018-03-21 ENCOUNTER — Encounter: Payer: Self-pay | Admitting: Rehabilitative and Restorative Service Providers"

## 2018-03-21 ENCOUNTER — Ambulatory Visit (INDEPENDENT_AMBULATORY_CARE_PROVIDER_SITE_OTHER): Payer: Managed Care, Other (non HMO) | Admitting: Rehabilitative and Restorative Service Providers"

## 2018-03-21 DIAGNOSIS — M6281 Muscle weakness (generalized): Secondary | ICD-10-CM | POA: Diagnosis not present

## 2018-03-21 DIAGNOSIS — R29898 Other symptoms and signs involving the musculoskeletal system: Secondary | ICD-10-CM | POA: Diagnosis not present

## 2018-03-21 DIAGNOSIS — R293 Abnormal posture: Secondary | ICD-10-CM

## 2018-03-21 DIAGNOSIS — M25612 Stiffness of left shoulder, not elsewhere classified: Secondary | ICD-10-CM

## 2018-03-21 DIAGNOSIS — M25512 Pain in left shoulder: Secondary | ICD-10-CM

## 2018-03-21 NOTE — Therapy (Signed)
[facility] [place] [address] [place], [place], [address] Phone: [phone]   Fax:  [phone]  Physical Therapy Treatment  Patient Details  Name: Terri Nicholson MRN: [id number] Date of Birth: 06-14-1956 Referring Provider: Dr Jones Broom   Encounter Date: 03/21/2018    Past Medical History:  Diagnosis Date  . Arthritis   . Asthma   . Diabetes mellitus without complication (HCC)   . Hypertension   . IFG (impaired fasting glucose)     Past Surgical History:  Procedure Laterality Date  . CARDIAC SURGERY    . CESAREAN SECTION    . EYE SURGERY    . HERNIA REPAIR      There were no vitals filed for this visit.  Subjective Assessment - 03/21/18 1205    Subjective  Miclele continues to work on her exercises at home - being cautious with activities     Currently in Pain?  Yes    Pain Score  3     Pain Location  Shoulder    Pain Orientation  Left;Anterior    Pain Descriptors / Indicators  Aching;Tightness    Pain Type  Chronic pain;Surgical pain    Pain Onset  More than a month ago    Pain Frequency  Intermittent         [facility] PT Assessment - 03/21/18 0001      Assessment   Medical Diagnosis  Lt RCR - supraspinatus; infraspinatus; labral repair     Referring Provider  Dr Jones Broom    Onset Date/Surgical Date  02/22/18 injury 11/26/17    Hand Dominance  Right    Next MD Visit  04/06/18    Prior Therapy  one visit prior to surgery - end of March      PROM   Right/Left Shoulder  -- pt sitting - limited per protocol     Left Shoulder Flexion  90 Degrees    Left Shoulder ABduction  -- scaption ~ 70 deg     Left Shoulder External Rotation  20 Degrees      Palpation   Palpation comment  muscular tightness through pecs; upper trap; leveator; deltoid; biceps Lt > Rt                    [facility] Adult PT Treatment/Exercise - 03/21/18 0001      Shoulder Exercises: Standing   Other Standing  Exercises  axial extension 10 sec x 5; lateral cervical flexion 10 sec x 2; cervical rotation 5 sec x 2; scp squeeze 10 sec x 10 with swim noodle     Other Standing Exercises  gripping foam basketball x 20; biceps curl 1# x 15      Shoulder Exercises: Stretch   External Rotation Stretch  5 reps;10 seconds standing ER w/ cane elbow at side     Table Stretch - Flexion  5 reps;10 seconds    Table Stretch -Flexion Limitations  standing step back hands on counter, 10 sec hold x 5     Other Shoulder Stretches  biceps stretch 30 sec x 3 to pt tolerance       Electrical Stimulation   Electrical Stimulation Location  Lt shoulder     Electrical Stimulation Action  IFC    Electrical Stimulation Parameters  to tolerance    Electrical Stimulation Goals  Pain;Tone      Vasopneumatic   Number Minutes Vasopneumatic   15 minutes    Vasopnuematic Location  Shoulder Lt    Vasopneumatic Pressure  Low    Vasopneumatic Temperature   34 deg       Manual Therapy   Manual therapy comments  pt supine     Joint Mobilization  Lt GH joint gently circumduction     Soft tissue mobilization  pecs; upper trap; leveator; periscapular musculature; biceps; deltoid Lt shoulder     Myofascial Release  pecs into anterior shoulder     Scapular Mobilization  scapular protraction and inferior glide    Passive ROM  PROM Lt shoulder - flexion, scaption; ER; extension pt in supine              PT Education - 03/21/18 1241    Education provided  Yes    Education Details  HEP     Person(s) Educated  Patient    Methods  Explanation;Demonstration;Tactile cues;Verbal cues;Handout    Comprehension  Verbalized understanding;Returned demonstration;Verbal cues required;Tactile cues required       PT Short Term Goals - 03/14/18 1403      PT SHORT TERM GOAL #1   Title  Decrease pain Lt shoulder to 0/10 to 2/10 for rest and no more than 5/10 with exercise 04/21/18    Time  6    Period  Weeks    Status  On-going      PT  SHORT TERM GOAL #2   Title  Improve posture and alignment with patient to demonstrate improved upright posture with posterior shoulder girdle engaged 04/21/18    Time  6    Period  Weeks    Status  On-going      PT SHORT TERM GOAL #3   Title  Lt shoulder PROM to 90 deg flexion and 20 deg ER - progressing with ROM per protocol 04/21/18    Time  6    Period  Weeks    Status  On-going        PT Long Term Goals - 03/14/18 1403      PT LONG TERM GOAL #1   Title  AROM Lt shoulder =/> AROM Rt shoulder 06/02/18    Time  12    Period  Weeks    Status  On-going      PT LONG TERM GOAL #2   Title  4-/5 to 4+/5 strength Lt shoulder 06/02/18    Time  12    Period  Weeks    Status  On-going      PT LONG TERM GOAL #3   Title  independent in HEP 06/02/18    Time  12    Period  Weeks    Status  On-going      PT LONG TERM GOAL #4   Title  improve FOTO =/> 48% limited 05/02/18    Time  12    Period  Weeks    Status  On-going              Patient will benefit from skilled therapeutic intervention in order to improve the following deficits and impairments:     Visit Diagnosis: Acute pain of left shoulder  Muscle weakness (generalized)  Stiffness of left shoulder joint  Other symptoms and signs involving the musculoskeletal system  Abnormal posture  Stiffness of left shoulder, not elsewhere classified     Problem List Patient Active Problem List   Diagnosis Date Noted  . Complete rotator cuff tear 01/05/2018  . Palpitations 03/04/2017  . DIABETES MELLITUS, TYPE II 09/19/2007  . HYPERTENSION  09/19/2007    Terri Nicholson PT, MPH  03/21/2018, 12:46 PM  [facility] [address] [place], [place], [address] Phone: [phone]   Fax:  [phone]  Name: Terri Nicholson MRN: [id number] Date of Birth: Mar 02, 1956

## 2018-03-21 NOTE — Patient Instructions (Signed)
ELBOW: Biceps - Standing    Standing in doorway, place one hand on wall, elbow straight. Lean forward. Hold _30__ seconds. _3__ reps per set, __2-3_ sets per day   Biceps Curl (Biceps Strength)    Sit holding _1_ lb weight at side, palm forward. Breathe in. Keeping elbow close to side, raise weight toward same shoulder, breathing out through pursed lips. Return slowly, breathing in. Repeat _10-20__ times. Repeat with other arm. Do _1-2__ sessions per day. Variation: Do without weight.   Towel Roll Squeeze    With right forearm resting on surface, gently squeeze towel. Repeat _15-20___ times per set. Do __1-2__ sets per session. Do _1-2___ sessions per day.

## 2018-03-29 ENCOUNTER — Encounter: Payer: Self-pay | Admitting: Rehabilitative and Restorative Service Providers"

## 2018-03-29 ENCOUNTER — Ambulatory Visit: Payer: Managed Care, Other (non HMO) | Admitting: Rehabilitative and Restorative Service Providers"

## 2018-03-29 DIAGNOSIS — R293 Abnormal posture: Secondary | ICD-10-CM

## 2018-03-29 DIAGNOSIS — M25612 Stiffness of left shoulder, not elsewhere classified: Secondary | ICD-10-CM

## 2018-03-29 DIAGNOSIS — M25512 Pain in left shoulder: Secondary | ICD-10-CM

## 2018-03-29 DIAGNOSIS — R29898 Other symptoms and signs involving the musculoskeletal system: Secondary | ICD-10-CM

## 2018-03-29 DIAGNOSIS — M6281 Muscle weakness (generalized): Secondary | ICD-10-CM

## 2018-03-29 NOTE — Therapy (Signed)
[facility] [facility] [place], [place], [address] Phone: [phone]   Fax:  [phone]  Physical Therapy Treatment  Patient Details  Name: Terri Nicholson MRN: [id number] Date of Birth: 01-12-1956 Referring Provider: Dr Tania Ade    Encounter Date: 03/29/2018  PT End of Session - 03/29/18 0932    Visit Number  3    Number of Visits  20    Date for PT Re-Evaluation  06/02/18    PT Start Time  0930    PT Stop Time  1029    PT Time Calculation (min)  59 min    Activity Tolerance  Patient tolerated treatment well       Past Medical History:  Diagnosis Date  . Arthritis   . Asthma   . Diabetes mellitus without complication ([place])   . Hypertension   . IFG (impaired fasting glucose)     Past Surgical History:  Procedure Laterality Date  . CARDIAC SURGERY    . CESAREAN SECTION    . EYE SURGERY    . HERNIA REPAIR      There were no vitals filed for this visit.  Subjective Assessment - 03/29/18 0933    Subjective  Terri Nicholson reports that she is having some soreness in the Lt shoulder. She has changed her diabetic medication with poor results. She reports increased soreness in the Lt shoulder - now along the top of the shoulder more than in the front of the shoulder.     Currently in Pain?  Yes    Pain Score  3     Pain Location  Shoulder    Pain Orientation  Left;Anterior    Pain Descriptors / Indicators  Aching;Tightness    Pain Radiating Towards  along clavicle and neck     Pain Onset  More than a month ago    Pain Frequency  Intermittent    Aggravating Factors   movement; prolonged postures; showering; holding a book; any lifting even with light effort     Pain Relieving Factors  ice; compression          [facility] PT Assessment - 03/29/18 0001      Assessment   Medical Diagnosis  Lt RCR - supraspinatus; infraspinatus; labral repair     Referring Provider  Dr Tania Ade     Onset  Date/Surgical Date  02/22/18 injury 11/26/17    Hand Dominance  Right    Next MD Visit  04/06/18    Prior Therapy  one visit prior to surgery - end of March      PROM   Right/Left Shoulder  -- pt supine    Left Shoulder Flexion  131 Degrees    Left Shoulder ABduction  128 Degrees in scapular plane    Left Shoulder External Rotation  46 Degrees in scapular plane       Palpation   Palpation comment  muscular tightness through pecs; upper trap; leveator; deltoid; biceps Lt > Rt                    [facility] Adult PT Treatment/Exercise - 03/29/18 0001      Shoulder Exercises: Supine   Flexion  AAROM;Left;5 reps      Shoulder Exercises: Standing   Extension  AAROM;Left;5 reps with cane     Other Standing Exercises  axial extension 10 sec x 5; lateral cervical flexion 10 sec x 2; cervical rotation 5 sec x 2; scp squeeze  10 sec x 10 with swim noodle       Shoulder Exercises: Stretch   External Rotation Stretch  5 reps;10 seconds standing ER w/ cane elbow at side     Table Stretch - Flexion  5 reps;10 seconds    Table Stretch -Flexion Limitations  standing step back hands on counter, 10 sec hold x 5     Other Shoulder Stretches  biceps stretch 30 sec x 3 to pt tolerance       Electrical Stimulation   Electrical Stimulation Location  Lt shoulder     Electrical Stimulation Action  IFC    Electrical Stimulation Parameters  to tolerance    Electrical Stimulation Goals  Pain;Tone      Vasopneumatic   Number Minutes Vasopneumatic   15 minutes    Vasopnuematic Location   Shoulder Lt    Vasopneumatic Pressure  Low    Vasopneumatic Temperature   34 deg       Manual Therapy   Manual therapy comments  pt supine     Joint Mobilization  Lt GH joint gently circumduction     Soft tissue mobilization  pecs; upper trap; leveator; periscapular musculature; biceps; deltoid Lt shoulder     Myofascial Release  pecs into anterior shoulder     Scapular Mobilization  scapular protraction and  inferior glide    Passive ROM  PROM Lt shoulder - flexion, scaption; ER; extension pt in supine              PT Education - 03/29/18 0952    Education provided  Yes    Education Details  HEP    Person(s) Educated  Patient    Methods  Explanation;Demonstration;Tactile cues;Verbal cues;Handout    Comprehension  Verbalized understanding;Returned demonstration;Verbal cues required;Tactile cues required       PT Short Term Goals - 03/29/18 0933      PT SHORT TERM GOAL #1   Title  Decrease pain Lt shoulder to 0/10 to 2/10 for rest and no more than 5/10 with exercise 04/21/18    Time  6    Period  Weeks    Status  Partially Met      PT SHORT TERM GOAL #2   Title  Improve posture and alignment with patient to demonstrate improved upright posture with posterior shoulder girdle engaged 04/21/18    Time  6    Period  Weeks    Status  On-going      PT SHORT TERM GOAL #3   Title  Lt shoulder PROM to 90 deg flexion and 20 deg ER - progressing with ROM per protocol 04/21/18    Time  6    Period  Weeks    Status  Achieved        PT Long Term Goals - 03/29/18 0932      PT LONG TERM GOAL #1   Title  AROM Lt shoulder =/> AROM Rt shoulder 06/02/18    Time  12    Period  Weeks    Status  On-going      PT LONG TERM GOAL #2   Title  4-/5 to 4+/5 strength Lt shoulder 06/02/18    Time  12    Period  Weeks    Status  On-going      PT LONG TERM GOAL #3   Title  independent in HEP 06/02/18    Time  12    Period  Weeks    Status  On-going  PT LONG TERM GOAL #4   Title  improve FOTO =/> 48% limited 05/02/18    Time  12    Period  Weeks    Status  On-going            Plan - 03/29/18 1020    Clinical Impression Statement  Patient continues to progress well with PROM. Added AAROM in shoulder flexion and extension. Patient has some increased soreness and discomfort in the area of the deloid likely due to medical problemswith AODM this past week.     Rehab Potential  Good     PT Frequency  2x / week    PT Treatment/Interventions  Patient/family education;ADLs/Self Care Home Management;Cryotherapy;Electrical Stimulation;Iontophoresis 105m/ml Dexamethasone;Moist Heat;Ultrasound;Dry needling;Manual techniques;Neuromuscular re-education;Therapeutic activities;Therapeutic exercise    PT Next Visit Plan  review HEP; add manual work and PROM Lt shoudler; continue with postural correction; progression of rehab per protocol note to MD at next visit       Patient will benefit from skilled therapeutic intervention in order to improve the following deficits and impairments:  Postural dysfunction, Improper body mechanics, Pain, Increased fascial restricitons, Increased muscle spasms, Decreased mobility, Decreased range of motion, Decreased strength, Decreased activity tolerance, Impaired UE functional use  Visit Diagnosis: Acute pain of left shoulder  Muscle weakness (generalized)  Stiffness of left shoulder joint  Other symptoms and signs involving the musculoskeletal system  Abnormal posture     Problem List Patient Active Problem List   Diagnosis Date Noted  . Complete rotator cuff tear 01/05/2018  . Palpitations 03/04/2017  . DIABETES MELLITUS, TYPE II 09/19/2007  . HYPERTENSION 09/19/2007    Terri Nicholson Terri Nicholson, Terri Nicholson  03/29/2018, 11:03 AM  [facility] [place] [address]Phone: [phone]  Fax:  [phone] Name: Terri HollenkampMRN: [id number] of Birth: [birth year]

## 2018-04-05 ENCOUNTER — Ambulatory Visit: Payer: Managed Care, Other (non HMO) | Admitting: Rehabilitative and Restorative Service Providers"

## 2018-04-05 ENCOUNTER — Encounter: Payer: Self-pay | Admitting: Rehabilitative and Restorative Service Providers"

## 2018-04-05 DIAGNOSIS — R29898 Other symptoms and signs involving the musculoskeletal system: Secondary | ICD-10-CM

## 2018-04-05 DIAGNOSIS — R293 Abnormal posture: Secondary | ICD-10-CM | POA: Diagnosis not present

## 2018-04-05 DIAGNOSIS — M25612 Stiffness of left shoulder, not elsewhere classified: Secondary | ICD-10-CM | POA: Diagnosis not present

## 2018-04-05 DIAGNOSIS — M25512 Pain in left shoulder: Secondary | ICD-10-CM | POA: Diagnosis not present

## 2018-04-05 DIAGNOSIS — M6281 Muscle weakness (generalized): Secondary | ICD-10-CM

## 2018-04-05 NOTE — Therapy (Signed)
Terri Nicholson, [place], [address] Phone: [phone]   Fax:  [phone]  Physical Therapy Treatment  Patient Details  Name: Terri Nicholson MRN: [id number] Date of Birth: 01-Jun-1956 Referring Provider: Dr Tania Ade    Encounter Date: 04/05/2018  PT End of Session - 04/05/18 1016    Visit Number  4    Number of Visits  20    Date for PT Re-Evaluation  06/02/18    PT Start Time  1540    PT Stop Time  1114    PT Time Calculation (min)  59 min    Activity Tolerance  Patient tolerated treatment well       Past Medical History:  Diagnosis Date  . Arthritis   . Asthma   . Diabetes mellitus without complication ([place])   . Hypertension   . IFG (impaired fasting glucose)     Past Surgical History:  Procedure Laterality Date  . CARDIAC SURGERY    . CESAREAN SECTION    . EYE SURGERY    . HERNIA REPAIR      There were no vitals filed for this visit.  Subjective Assessment - 04/05/18 1018    Subjective  Patient reports that she is out of her sling in her home but is in the sling when she is out. She reports that she fell last week but does not feel she injured her shoulder. She has some soreness but exercixes are no worse than before the fall and she does not have increased pain in the Lt shoulder.     Currently in Pain?  Yes    Pain Score  2     Pain Location  Shoulder    Pain Orientation  Left;Anterior    Pain Type  Chronic pain;Surgical pain    Pain Radiating Towards  along clavicle and neck     Pain Onset  More than a month ago    Pain Frequency  Intermittent    Aggravating Factors   movement; prolongede postures; showering; holding a book; any lifting     Pain Relieving Factors  ice; compression          [facility] PT Assessment - 04/05/18 0001      Assessment   Medical Diagnosis  Lt RCR - supraspinatus; infraspinatus; labral repair     Referring Provider  Dr Tania Ade     Onset Date/Surgical Date  02/22/18 injury 11/26/17    Hand Dominance  Right    Next MD Visit  04/06/18    Prior Therapy  one visit prior to surgery - end of March      AROM   Right/Left Shoulder  -- Not tested - only 6 weeks post surgery       PROM   Left Shoulder Flexion  135 Degrees    Left Shoulder ABduction  128 Degrees    Left Shoulder External Rotation  46 Degrees in scapular plane       Palpation   Palpation comment  muscular tightness through pecs; upper trap; leveator; deltoid; biceps Lt > Rt                    [facility] Adult PT Treatment/Exercise - 04/05/18 0001      Shoulder Exercises: Supine   Flexion  AAROM;Left;5 reps    Other Supine Exercises  forearm supination/pronation; grip exercises       Shoulder Exercises: Standing   Extension  AAROM;Left;5 reps  with cane       Shoulder Exercises: Pulleys   Flexion  -- 10 sec hold x 5 reps       Shoulder Exercises: Stretch   External Rotation Stretch  5 reps;10 seconds standing ER w/ cane elbow at side     Table Stretch - Flexion  5 reps;10 seconds    Table Stretch -Flexion Limitations  standing step back hands on counter, 10 sec hold x 5     Other Shoulder Stretches  biceps stretch 30 sec x 3 to pt tolerance       Electrical Stimulation   Electrical Stimulation Location  Lt shoulder     Electrical Stimulation Action  IFC    Electrical Stimulation Parameters  to tolerance    Electrical Stimulation Goals  Pain;Tone      Vasopneumatic   Number Minutes Vasopneumatic   15 minutes    Vasopnuematic Location   Shoulder Lt    Vasopneumatic Pressure  Low    Vasopneumatic Temperature   34 deg                PT Short Term Goals - 04/05/18 1017      PT SHORT TERM GOAL #1   Title  Decrease pain Lt shoulder to 0/10 to 2/10 for rest and no more than 5/10 with exercise 04/21/18    Time  6    Period  Weeks    Status  Partially Met      PT SHORT TERM GOAL #2   Title  Improve posture and alignment with  patient to demonstrate improved upright posture with posterior shoulder girdle engaged 04/21/18    Time  6    Period  Weeks    Status  On-going      PT SHORT TERM GOAL #3   Title  Lt shoulder PROM to 90 deg flexion and 20 deg ER - progressing with ROM per protocol 04/21/18    Time  6    Period  Weeks    Status  Achieved        PT Long Term Goals - 04/05/18 1017      PT LONG TERM GOAL #1   Title  AROM Lt shoulder =/> AROM Rt shoulder 06/02/18    Time  12    Period  Weeks    Status  On-going      PT LONG TERM GOAL #2   Title  4-/5 to 4+/5 strength Lt shoulder 06/02/18    Time  12    Period  Weeks    Status  On-going      PT LONG TERM GOAL #3   Title  independent in HEP 06/02/18    Time  12    Period  Weeks    Status  On-going      PT LONG TERM GOAL #4   Title  improve FOTO =/> 48% limited 05/02/18    Time  12    Period  Weeks    Status  On-going            Plan - 04/05/18 1029    Clinical Impression Statement  Terri Nicholson continues to progress well post Lt RCR; [facility] 02/22/18. She has added AAROM flexion, ER in neutral, and extension. She tolerates PROM well and only had some soreness following exercise and therapy. PROM is gradually increasing. She continues to have some soreness and discomfort in the area of the Lt deltoid.     Rehab Potential  Good  PT Frequency  2x / week    PT Duration  12 weeks    PT Treatment/Interventions  Patient/family education;ADLs/Self Care Home Management;Cryotherapy;Electrical Stimulation;Iontophoresis 46m/ml Dexamethasone;Moist Heat;Ultrasound;Dry needling;Manual techniques;Neuromuscular re-education;Therapeutic activities;Therapeutic exercise    PT Next Visit Plan  review HEP; add manual work and PROM Lt shoudler; continue with postural correction; progression of rehab per protocol     Consulted and Agree with Plan of Care  Patient       Patient will benefit from skilled therapeutic intervention in order to improve the following deficits  and impairments:  Postural dysfunction, Improper body mechanics, Pain, Increased fascial restricitons, Increased muscle spasms, Decreased mobility, Decreased range of motion, Decreased strength, Decreased activity tolerance, Impaired UE functional use  Visit Diagnosis: Acute pain of left shoulder  Muscle weakness (generalized)  Stiffness of left shoulder joint  Other symptoms and signs involving the musculoskeletal system  Abnormal posture  Stiffness of left shoulder, not elsewhere classified     Problem List Patient Active Problem List   Diagnosis Date Noted  . Complete rotator cuff tear 01/05/2018  . Palpitations 03/04/2017  . DIABETES MELLITUS, TYPE II 09/19/2007  . HYPERTENSION 09/19/2007    Merton Wadlow PNilda SimmerPT, MPH  04/05/2018, 11:12 AM  [facility] [address]Phone: [phone]  Fax:  [phone] Name: Terri GaydosMRN: [id number] of Birth: [birth year]

## 2018-04-05 NOTE — Patient Instructions (Signed)
[employer] - [employer] [facility] [employer] [place]  Sitting in straight backed chair(can place noodle behind back) Hands are facing each other Use right arm to pull left arm up - keeping shoulders relaxed Hold 10 sec 10 reps 2-3 times a day

## 2018-04-08 ENCOUNTER — Telehealth: Payer: Self-pay | Admitting: Rehabilitative and Restorative Service Providers"

## 2018-04-08 NOTE — Telephone Encounter (Signed)
Returned call to patient to clarify home exercise instructions for pulley.    Celyn P. Leonor LivHolt PT, MPH 04/08/18 3:03 PM

## 2018-04-12 ENCOUNTER — Ambulatory Visit: Payer: Managed Care, Other (non HMO) | Admitting: Rehabilitative and Restorative Service Providers"

## 2018-04-12 ENCOUNTER — Encounter: Payer: Self-pay | Admitting: Rehabilitative and Restorative Service Providers"

## 2018-04-12 DIAGNOSIS — R29898 Other symptoms and signs involving the musculoskeletal system: Secondary | ICD-10-CM

## 2018-04-12 DIAGNOSIS — M25512 Pain in left shoulder: Secondary | ICD-10-CM

## 2018-04-12 DIAGNOSIS — M25612 Stiffness of left shoulder, not elsewhere classified: Secondary | ICD-10-CM | POA: Diagnosis not present

## 2018-04-12 DIAGNOSIS — M6281 Muscle weakness (generalized): Secondary | ICD-10-CM | POA: Diagnosis not present

## 2018-04-12 DIAGNOSIS — R293 Abnormal posture: Secondary | ICD-10-CM | POA: Diagnosis not present

## 2018-04-12 NOTE — Patient Instructions (Addendum)
All isometric exercises - 5 sec hold x 5 reps x 1-2/day   Strengthening: Isometric External Rotation    Using wall to provide resistance, and keeping right arm at side, press back of hand into ball using light pressure. Hold ____ seconds. Repeat ____ times per set. Do ____ sets per session. Do ____ sessions per day.    Strengthening: Isometric Internal Rotation    Using door frame for resistance, press palm of right hand into ball using light pressure. Keep elbow in at side. Hold ____ seconds. Repeat ____ times per set. Do ____ sets per session. Do ____ sessions per day.    Strengthening: Isometric Extension    Using wall for resistance, press back of left arm into ball using light pressure. Hold ____ seconds. Repeat ____ times per set. Do ____ sets per session. Do ____ sessions per day.    Strengthening: Isometric Abduction    Using wall for resistance, press left arm into ball using light pressure. Hold ____ seconds. Repeat ____ times per set. Do ____ sets per session. Do ____ sessions per day.    Scapular Retraction (standing at edge of counter or table)    Head resting on right arm - left arm hanging toward floor at 90 deg lift left arm up beside body Pause  And lower slowly Repeat __5-10__ times per set. Do __1-2 __ sets per session. Do __1__ sessions per day.   Same position as above - lift arm to side with elbow bent to 90 degrees  Row motion Same reps

## 2018-04-12 NOTE — Therapy (Signed)
[place], [place], [address] Phone: [phone]   Fax:  [phone]  Physical Therapy Treatment  Patient Details  Name: Terri Nicholson MRN: [id number] Date of Birth: 1956-09-10 Referring Provider: Dr Tania Ade   Encounter Date: 04/12/2018  PT End of Session - 04/12/18 1201    Visit Number  5    Number of Visits  20    Date for PT Re-Evaluation  06/02/18    PT Start Time  7893    PT Stop Time  1246    PT Time Calculation (min)  61 min    Activity Tolerance  Patient tolerated treatment well       Past Medical History:  Diagnosis Date  . Arthritis   . Asthma   . Diabetes mellitus without complication ([place])   . Hypertension   . IFG (impaired fasting glucose)     Past Surgical History:  Procedure Laterality Date  . CARDIAC SURGERY    . CESAREAN SECTION    . EYE SURGERY    . HERNIA REPAIR      There were no vitals filed for this visit.  Subjective Assessment - 04/12/18 1201    Subjective  Saw PA this week - who was pleased with progress. Patient has pulley in home and has been using the pulley at home.     Currently in Pain?  Yes    Pain Score  2     Pain Location  Shoulder    Pain Orientation  Left;Anterior    Pain Descriptors / Indicators  Aching;Tightness    Pain Type  Chronic pain;Surgical pain    Pain Onset  More than a month ago    Pain Frequency  Intermittent         [facility] PT Assessment - 04/12/18 0001      Assessment   Medical Diagnosis  Lt RCR - supraspinatus; infraspinatus; labral repair     Referring Provider  Dr Tania Ade    Onset Date/Surgical Date  02/22/18 injury 11/26/17    Hand Dominance  Right    Prior Therapy  one visit prior to surgery - end of March      PROM   Left Shoulder Flexion  138 Degrees    Left Shoulder ABduction  130 Degrees    Left Shoulder External Rotation  46 Degrees in scapular plane       Palpation   Palpation comment   decreasing muscular tightness through pecs; upper trap; leveator; deltoid; biceps Lt > Rt                    [facility] Adult PT Treatment/Exercise - 04/12/18 0001      Shoulder Exercises: Standing   Extension  AAROM;Left;5 reps with cane     Extension Weight (lbs)  bent forward shoulder extension - no weights x 10     Row  AROM;Left;10 reps bent forward row     Other Standing Exercises  scap squeeze 10 sec x 10 with noodle       Shoulder Exercises: Pulleys   Flexion  -- 10 sec hold x 5 reps       Shoulder Exercises: Isometric Strengthening   Extension  5X5"    External Rotation  5X5"    Internal Rotation  5X5"    ABduction  5X5"      Shoulder Exercises: Stretch   External Rotation Stretch  5 reps;10 seconds standing ER w/ cane  elbow at side     Table Stretch - Flexion  5 reps;10 seconds    Table Stretch -Flexion Limitations  standing step back hands on counter, 10 sec hold x 5     Other Shoulder Stretches  biceps stretch 30 sec x 3 to pt tolerance       Electrical Stimulation   Electrical Stimulation Location  Lt shoulder     Electrical Stimulation Action  IFC    Electrical Stimulation Parameters  to tolerance    Electrical Stimulation Goals  Pain;Tone      Vasopneumatic   Number Minutes Vasopneumatic   15 minutes    Vasopnuematic Location   Shoulder Lt    Vasopneumatic Pressure  Low    Vasopneumatic Temperature   34 deg       Manual Therapy   Manual therapy comments  pt supine     Joint Mobilization  Lt GH joint gently circumduction     Soft tissue mobilization  pecs; upper trap; leveator; periscapular musculature; biceps; deltoid Lt shoulder     Myofascial Release  pecs into anterior shoulder     Scapular Mobilization  scapular protraction and inferior glide    Passive ROM  PROM Lt shoulder - flexion, scaption; ER; extension pt in supine              PT Education - 04/12/18 1228    Education provided  Yes    Education Details  HEP     Person(s)  Educated  Patient    Methods  Explanation;Demonstration;Tactile cues;Verbal cues;Handout    Comprehension  Verbalized understanding;Returned demonstration;Verbal cues required;Tactile cues required       PT Short Term Goals - 04/12/18 1203      PT SHORT TERM GOAL #1   Title  Decrease pain Lt shoulder to 0/10 to 2/10 for rest and no more than 5/10 with exercise 04/21/18    Time  6    Period  Weeks    Status  Partially Met      PT SHORT TERM GOAL #2   Title  Improve posture and alignment with patient to demonstrate improved upright posture with posterior shoulder girdle engaged 04/21/18    Time  6    Period  Weeks    Status  On-going      PT SHORT TERM GOAL #3   Title  Lt shoulder PROM to 90 deg flexion and 20 deg ER - progressing with ROM per protocol 04/21/18    Time  6    Period  Weeks    Status  Achieved        PT Long Term Goals - 04/12/18 1203      PT LONG TERM GOAL #1   Title  AROM Lt shoulder =/> AROM Rt shoulder 06/02/18    Time  12    Period  Weeks    Status  On-going      PT LONG TERM GOAL #2   Title  4-/5 to 4+/5 strength Lt shoulder 06/02/18    Time  12    Period  Weeks    Status  On-going      PT LONG TERM GOAL #3   Title  independent in HEP 06/02/18    Time  12    Period  Weeks    Status  On-going      PT LONG TERM GOAL #4   Title  improve FOTO =/> 48% limited 05/02/18    Time  12  Period  Weeks    Status  On-going            Plan - 04/12/18 1204    Clinical Impression Statement  Patient continues to progress with shoulder rehab per protocol. She added isometric exercise and initiated AROM exercise     Rehab Potential  Good    PT Frequency  2x / week    PT Duration  12 weeks    PT Treatment/Interventions  Patient/family education;ADLs/Self Care Home Management;Cryotherapy;Electrical Stimulation;Iontophoresis 21m/ml Dexamethasone;Moist Heat;Ultrasound;Dry needling;Manual techniques;Neuromuscular re-education;Therapeutic activities;Therapeutic  exercise    PT Next Visit Plan  review HEP; add manual work and PROM Lt shoudler; continue with postural correction; progression of rehab per protocol     Consulted and Agree with Plan of Care  Patient       Patient will benefit from skilled therapeutic intervention in order to improve the following deficits and impairments:  Postural dysfunction, Improper body mechanics, Pain, Increased fascial restricitons, Increased muscle spasms, Decreased mobility, Decreased range of motion, Decreased strength, Decreased activity tolerance, Impaired UE functional use  Visit Diagnosis: Acute pain of left shoulder  Muscle weakness (generalized)  Stiffness of left shoulder joint  Other symptoms and signs involving the musculoskeletal system  Abnormal posture  Stiffness of left shoulder, not elsewhere classified     Problem List Patient Active Problem List   Diagnosis Date Noted  . Complete rotator cuff tear 01/05/2018  . Palpitations 03/04/2017  . DIABETES MELLITUS, TYPE II 09/19/2007  . HYPERTENSION 09/19/2007    Davis Ambrosini PNilda SimmerPT, MPH  04/12/2018, 12:53 PM  [facility] [address]Phone: [phone]  Fax:  [phone] Name: MBlayne GarlickMRN: [id number] of Birth: 9December 13, 1957

## 2018-04-18 ENCOUNTER — Encounter: Payer: Self-pay | Admitting: Rehabilitative and Restorative Service Providers"

## 2018-04-20 ENCOUNTER — Ambulatory Visit (INDEPENDENT_AMBULATORY_CARE_PROVIDER_SITE_OTHER): Payer: Managed Care, Other (non HMO) | Admitting: Rehabilitative and Restorative Service Providers"

## 2018-04-20 DIAGNOSIS — M6281 Muscle weakness (generalized): Secondary | ICD-10-CM | POA: Diagnosis not present

## 2018-04-20 DIAGNOSIS — R29898 Other symptoms and signs involving the musculoskeletal system: Secondary | ICD-10-CM

## 2018-04-20 DIAGNOSIS — M25512 Pain in left shoulder: Secondary | ICD-10-CM

## 2018-04-20 DIAGNOSIS — R293 Abnormal posture: Secondary | ICD-10-CM | POA: Diagnosis not present

## 2018-04-20 DIAGNOSIS — M25612 Stiffness of left shoulder, not elsewhere classified: Secondary | ICD-10-CM | POA: Diagnosis not present

## 2018-04-20 NOTE — Patient Instructions (Addendum)
Add scaption with pulley - hands facing out  10 sec hold x 10 reps   Sit tall and straight with hands resting on either side - on sofa or bed(did this with large ball in clinic) Push down hold 10 sec x 10 reps     Pool exercise Standing in water chest high, always keep shoulders down and back.  Scaption standing (hands facing forward, move arms up and down) Stand and bring arms out and in with elbows bent as if using cane Windmill (out and in like with cane) Let arm float to water level, then bring back down

## 2018-04-20 NOTE — Therapy (Signed)
[place], [place], [address] Phone: [phone]   Fax:  [phone]  Physical Therapy Treatment  Patient Details  Name: Terri Nicholson MRN: [id number] Date of Birth: Sep 09, 1956 Referring Provider: Dr Tania Ade   Encounter Date: 04/20/2018  PT End of Session - 04/20/18 1516    Visit Number  6    Number of Visits  20    Date for PT Re-Evaluation  06/02/18    PT Start Time  4008    PT Stop Time  6761    PT Time Calculation (min)  59 min    Activity Tolerance  Patient tolerated treatment well       Past Medical History:  Diagnosis Date  . Arthritis   . Asthma   . Diabetes mellitus without complication ([place])   . Hypertension   . IFG (impaired fasting glucose)     Past Surgical History:  Procedure Laterality Date  . CARDIAC SURGERY    . CESAREAN SECTION    . EYE SURGERY    . HERNIA REPAIR      There were no vitals filed for this visit.  Subjective Assessment - 04/20/18 1518    Subjective  some pain in the back part of the shoulder today. She has some continued pain on an intermittent basis.     Currently in Pain?  Yes    Pain Score  3     Pain Location  Shoulder    Pain Orientation  Left;Posterior    Pain Descriptors / Indicators  Aching;Tightness    Pain Type  Chronic pain;Surgical pain    Pain Radiating Towards  clavicle and neck and now into the upper trap     Pain Onset  More than a month ago    Pain Frequency  Intermittent         [facility] PT Assessment - 04/20/18 0001      Assessment   Medical Diagnosis  Lt RCR - supraspinatus; infraspinatus; labral repair     Referring Provider  Dr Tania Ade    Onset Date/Surgical Date  02/22/18 injury 11/26/17    Hand Dominance  Right    Prior Therapy  one visit prior to surgery - end of March      PROM   Left Shoulder Flexion  140 Degrees    Left Shoulder ABduction  134 Degrees    Left Shoulder External Rotation  50  Degrees in scapular plane       Palpation   Palpation comment  decreasing muscular tightness through pecs; upper trap; leveator; deltoid; biceps Lt > Rt                    [facility] Adult PT Treatment/Exercise - 04/20/18 0001      Shoulder Exercises: Sidelying   External Rotation  AROM;Left;10 reps      Shoulder Exercises: Standing   ABduction  -- scaption - ball at noodle 2 sets of 5 to pt tolerance    Extension  AAROM;Left;5 reps with cane     Extension Weight (lbs)  bent forward shoulder extension - no weights x 10     Row  AROM;Left;10 reps bent forward row     Other Standing Exercises  scap squeeze 10 sec x 10 with noodle       Shoulder Exercises: Pulleys   Flexion  -- 10 sec hold x 5 reps     Scaption  -- 10 sec hold  x 10 reps       Shoulder Exercises: Therapy Ball   Other Therapy Ball Exercises  press down in to large therapy ball 10 sec x 10 reps       Shoulder Exercises: ROM/Strengthening   Pendulum  30 CW/30 CCW repeated 3 times during exercise       Shoulder Exercises: Stretch   External Rotation Stretch  5 reps;10 seconds standing ER w/ cane elbow at side     Table Stretch - Flexion  5 reps;10 seconds    Table Stretch -Flexion Limitations  standing step back hands on counter, 10 sec hold x 5     Other Shoulder Stretches  biceps stretch 30 sec x 3 to pt tolerance       Electrical Stimulation   Electrical Stimulation Location  Lt shoulder     Electrical Stimulation Action  IFC    Electrical Stimulation Parameters  to tolerance    Electrical Stimulation Goals  Pain;Tone      Vasopneumatic   Number Minutes Vasopneumatic   15 minutes    Vasopnuematic Location   Shoulder Lt     Vasopneumatic Pressure  Low    Vasopneumatic Temperature   34 deg       Manual Therapy   Manual therapy comments  pt supine     Joint Mobilization  Lt GH joint gently circumduction     Soft tissue mobilization  pecs; upper trap; leveator; periscapular musculature; biceps; deltoid  Lt shoulder     Myofascial Release  pecs into anterior shoulder     Scapular Mobilization  scapular protraction and inferior glide    Passive ROM  PROM Lt shoulder - flexion, scaption; ER; extension pt in supine              PT Education - 04/20/18 1606    Education provided  Yes    Education Details  HEP     Person(s) Educated  Patient    Methods  Explanation;Demonstration;Tactile cues;Verbal cues;Handout    Comprehension  Verbalized understanding;Returned demonstration;Verbal cues required;Tactile cues required       PT Short Term Goals - 04/20/18 1518      PT SHORT TERM GOAL #1   Title  Decrease pain Lt shoulder to 0/10 to 2/10 for rest and no more than 5/10 with exercise 04/21/18    Time  6    Period  Weeks    Status  Partially Met      PT SHORT TERM GOAL #2   Title  Improve posture and alignment with patient to demonstrate improved upright posture with posterior shoulder girdle engaged 04/21/18    Time  6    Period  Weeks    Status  On-going        PT Long Term Goals - 04/20/18 1517      PT LONG TERM GOAL #1   Title  AROM Lt shoulder =/> AROM Rt shoulder 06/02/18    Time  12    Period  Weeks    Status  On-going      PT LONG TERM GOAL #2   Title  4-/5 to 4+/5 strength Lt shoulder 06/02/18    Time  12    Period  Weeks    Status  On-going      PT LONG TERM GOAL #3   Title  independent in HEP 06/02/18    Time  12    Period  Weeks    Status  On-going  PT LONG TERM GOAL #4   Title  improve FOTO =/> 48% limited 05/02/18    Time  12    Period  Weeks    Status  On-going            Plan - 04/20/18 1536    Clinical Impression Statement  Terri Nicholson continues to progress well with shoulder rehab - added active exercises today. Continued with manual work and PROM to increase shoulder range. Gradual progress continues.     Rehab Potential  Good    PT Frequency  2x / week    PT Duration  12 weeks    PT Treatment/Interventions  Patient/family  education;ADLs/Self Care Home Management;Cryotherapy;Electrical Stimulation;Iontophoresis 75m/ml Dexamethasone;Moist Heat;Ultrasound;Dry needling;Manual techniques;Neuromuscular re-education;Therapeutic activities;Therapeutic exercise    PT Next Visit Plan  review HEP; add manual work and PROM Lt shoudler; continue with postural correction; progression of rehab per protocol     Consulted and Agree with Plan of Care  Patient       Patient will benefit from skilled therapeutic intervention in order to improve the following deficits and impairments:  Postural dysfunction, Improper body mechanics, Pain, Increased fascial restricitons, Increased muscle spasms, Decreased mobility, Decreased range of motion, Decreased strength, Decreased activity tolerance, Impaired UE functional use  Visit Diagnosis: Acute pain of left shoulder  Muscle weakness (generalized)  Stiffness of left shoulder joint  Other symptoms and signs involving the musculoskeletal system  Abnormal posture  Stiffness of left shoulder, not elsewhere classified     Problem List Patient Active Problem List   Diagnosis Date Noted  . Complete rotator cuff tear 01/05/2018  . Palpitations 03/04/2017  . DIABETES MELLITUS, TYPE II 09/19/2007  . HYPERTENSION 09/19/2007    Terri Nicholson PNilda SimmerPT, MPH  04/20/2018, 5:08 PM  [facility] [address]Phone: [phone]  Fax:  [phone] Name: Terri NiermanMRN: [id number] of Birth: 9May 15, 1957

## 2018-04-26 ENCOUNTER — Ambulatory Visit: Payer: Managed Care, Other (non HMO) | Admitting: Rehabilitative and Restorative Service Providers"

## 2018-04-26 ENCOUNTER — Encounter: Payer: Self-pay | Admitting: Rehabilitative and Restorative Service Providers"

## 2018-04-26 DIAGNOSIS — R293 Abnormal posture: Secondary | ICD-10-CM | POA: Diagnosis not present

## 2018-04-26 DIAGNOSIS — M25512 Pain in left shoulder: Secondary | ICD-10-CM

## 2018-04-26 DIAGNOSIS — R29898 Other symptoms and signs involving the musculoskeletal system: Secondary | ICD-10-CM

## 2018-04-26 DIAGNOSIS — M6281 Muscle weakness (generalized): Secondary | ICD-10-CM | POA: Diagnosis not present

## 2018-04-26 DIAGNOSIS — M25612 Stiffness of left shoulder, not elsewhere classified: Secondary | ICD-10-CM

## 2018-04-26 NOTE — Therapy (Signed)
[place], [place], [address] Phone: [phone]   Fax:  [phone]  Physical Therapy Treatment  Patient Details  Name: Terri Nicholson MRN: [id number] Date of Birth: 1956/02/09 Referring Provider: Dr Tania Ade   Encounter Date: 04/26/2018  PT End of Session - 04/26/18 1112    Visit Number  7    Number of Visits  20    Date for PT Re-Evaluation  06/02/18    PT Start Time  1106    PT Stop Time  1200    PT Time Calculation (min)  54 min    Activity Tolerance  Patient tolerated treatment well       Past Medical History:  Diagnosis Date  . Arthritis   . Asthma   . Diabetes mellitus without complication ([place])   . Hypertension   . IFG (impaired fasting glucose)     Past Surgical History:  Procedure Laterality Date  . CARDIAC SURGERY    . CESAREAN SECTION    . EYE SURGERY    . HERNIA REPAIR      There were no vitals filed for this visit.  Subjective Assessment - 04/26/18 1110    Subjective  Patient reports that pain in the shoulder increases to 5-6/10 with exercises and is then somewhat irritated most of the day. She has not used her TENS unit for discomfort. Patient will be out of town on vacation next week.     Currently in Pain?  Yes    Pain Score  2     Pain Location  Shoulder    Pain Orientation  Left;Posterior    Pain Descriptors / Indicators  Aching;Tightness    Pain Type  Chronic pain;Surgical pain    Pain Onset  More than a month ago    Pain Frequency  Intermittent         [facility] PT Assessment - 04/26/18 0001      Assessment   Medical Diagnosis  Lt RCR - supraspinatus; infraspinatus; labral repair     Referring Provider  Dr Tania Ade    Onset Date/Surgical Date  02/22/18 injury 11/26/17    Hand Dominance  Right      PROM   Left Shoulder Flexion  148 Degrees    Left Shoulder ABduction  145 Degrees in scaption     Left Shoulder External Rotation  58  Degrees in scapular plane       Palpation   Palpation comment  continued muscular tightness through pecs; upper trap; leveator; deltoid; biceps Lt > Rt                    [facility] Adult PT Treatment/Exercise - 04/26/18 0001      Shoulder Exercises: Standing   ABduction  -- scaption - back along noodle x10- VC for correct form     Extension  AAROM;Left;5 reps with cane     Extension Weight (lbs)  bent forward shoulder extension - no weights x 10     Other Standing Exercises  scap squeeze 10 sec x 10 with noodle       Shoulder Exercises: Pulleys   Flexion  -- 10 sec hold x 5 reps     Scaption  -- 10 sec hold x 10 reps       Shoulder Exercises: ROM/Strengthening   Pendulum  30 CW/30 CCW repeated 3 times during exercise       Shoulder Exercises: Stretch  Wall Stretch - Flexion  3 reps;10 seconds wall or door facing     Table Stretch - Flexion  5 reps;10 seconds    Table Stretch -Flexion Limitations  standing step back hands on counter, 10 sec hold x 5       Electrical Stimulation   Electrical Stimulation Location  Lt shoulder     Electrical Stimulation Action  TENS     Electrical Stimulation Parameters  to tolerance    Electrical Stimulation Goals  Pain;Tone      Vasopneumatic   Number Minutes Vasopneumatic   15 minutes    Vasopnuematic Location   Shoulder Lt     Vasopneumatic Pressure  Low    Vasopneumatic Temperature   34 deg       Manual Therapy   Manual therapy comments  pt supine     Joint Mobilization  Lt GH joint gently circumduction     Soft tissue mobilization  pecs; upper trap; leveator; periscapular musculature; biceps; deltoid Lt shoulder     Myofascial Release  pecs into anterior shoulder     Scapular Mobilization  scapular protraction and inferior glide    Passive ROM  PROM Lt shoulder - flexion, scaption; ER; extension pt in supine              PT Education - 04/26/18 1129    Education provided  Yes    Education Details  HEP     Person(s)  Educated  Patient    Methods  Explanation;Demonstration;Tactile cues;Handout;Verbal cues    Comprehension  Verbalized understanding;Returned demonstration;Verbal cues required;Tactile cues required       PT Short Term Goals - 04/26/18 1112      PT SHORT TERM GOAL #1   Title  Decrease pain Lt shoulder to 0/10 to 2/10 for rest and no more than 5/10 with exercise 04/21/18    Time  6    Period  Weeks    Status  Partially Met      PT SHORT TERM GOAL #2   Title  Improve posture and alignment with patient to demonstrate improved upright posture with posterior shoulder girdle engaged 04/21/18    Time  6    Period  Weeks    Status  On-going      PT SHORT TERM GOAL #3   Title  Lt shoulder PROM to 90 deg flexion and 20 deg ER - progressing with ROM per protocol 04/21/18    Time  6    Period  Weeks    Status  Achieved        PT Long Term Goals - 04/26/18 1112      PT LONG TERM GOAL #1   Title  AROM Lt shoulder =/> AROM Rt shoulder 06/02/18    Time  12    Period  Weeks    Status  On-going      PT LONG TERM GOAL #2   Title  4-/5 to 4+/5 strength Lt shoulder 06/02/18    Time  12    Period  Weeks    Status  On-going      PT LONG TERM GOAL #3   Title  independent in HEP 06/02/18    Time  12    Period  Weeks    Status  On-going      PT LONG TERM GOAL #4   Title  improve FOTO =/> 48% limited 05/02/18    Time  12    Period  Weeks  Status  On-going            Plan - 04/26/18 1113    Clinical Impression Statement  Lurine continues to progress gradually with shoulder rehab, progressing with exercises per protocol. Continue with manual work; PROM; postural correction. No new exercises today. Discussed and modified exercises to decrease pain and discomfort following exercise and shorten the length of time for the exercises. She was also encouraged to use her TENS unit for pain management. Hoang will be out of town on vacatioin next week. We will continue PT the following week.      Rehab Potential  Good    PT Frequency  2x / week    PT Duration  12 weeks    PT Treatment/Interventions  Patient/family education;ADLs/Self Care Home Management;Cryotherapy;Electrical Stimulation;Iontophoresis 99m/ml Dexamethasone;Moist Heat;Ultrasound;Dry needling;Manual techniques;Neuromuscular re-education;Therapeutic activities;Therapeutic exercise    PT Next Visit Plan  review HEP; add manual work and PROM Lt shoudler; continue with postural correction; progression of rehab per protocol     Consulted and Agree with Plan of Care  Patient       Patient will benefit from skilled therapeutic intervention in order to improve the following deficits and impairments:  Postural dysfunction, Improper body mechanics, Pain, Increased fascial restricitons, Increased muscle spasms, Decreased mobility, Decreased range of motion, Decreased strength, Decreased activity tolerance, Impaired UE functional use  Visit Diagnosis: Acute pain of left shoulder  Muscle weakness (generalized)  Stiffness of left shoulder joint  Other symptoms and signs involving the musculoskeletal system  Abnormal posture  Stiffness of left shoulder, not elsewhere classified     Problem List Patient Active Problem List   Diagnosis Date Noted  . Complete rotator cuff tear 01/05/2018  . Palpitations 03/04/2017  . DIABETES MELLITUS, TYPE II 09/19/2007  . HYPERTENSION 09/19/2007    Hazelyn Kallen PNilda SimmerPT, MPH  04/26/2018, 12:43 PM  [facility] [address]Phone: [phone]  Fax:  [phone] Name: MSamika VetschMRN: [id number] of Birth: 907-02-1956

## 2018-05-10 ENCOUNTER — Encounter: Payer: Self-pay | Admitting: Rehabilitative and Restorative Service Providers"

## 2018-05-10 ENCOUNTER — Ambulatory Visit (INDEPENDENT_AMBULATORY_CARE_PROVIDER_SITE_OTHER): Payer: Managed Care, Other (non HMO) | Admitting: Rehabilitative and Restorative Service Providers"

## 2018-05-10 DIAGNOSIS — M25512 Pain in left shoulder: Secondary | ICD-10-CM

## 2018-05-10 DIAGNOSIS — R29898 Other symptoms and signs involving the musculoskeletal system: Secondary | ICD-10-CM

## 2018-05-10 DIAGNOSIS — R293 Abnormal posture: Secondary | ICD-10-CM

## 2018-05-10 DIAGNOSIS — M25612 Stiffness of left shoulder, not elsewhere classified: Secondary | ICD-10-CM

## 2018-05-10 DIAGNOSIS — M6281 Muscle weakness (generalized): Secondary | ICD-10-CM

## 2018-05-10 NOTE — Patient Instructions (Addendum)
Progressive Resisted: Abduction (Standing)    Holding _1___ pound weights, raise arms out from sides leading with thumbs. Repeat __10__ times per set. Do __2-3__ sets per session. Do _1___ sessions per day.   Resisted External Rotation: in Neutral - Bilateral   PALMS UP Sit or stand, tubing in both hands, elbows at sides, bent to 90, forearms forward. Pinch shoulder blades together and rotate forearms out. Keep elbows at sides. Repeat __10__ times per set. Do _2-3___ sets per session. Do _2-3___ sessions per day.   Low Row: Standing   Face anchor, feet shoulder width apart. Palms up, pull arms back, squeezing shoulder blades together. Repeat 10__ times per set. Do 2-3__ sets per session. Do 1_ sessions per week. Anchor Height: Waist   Strengthening: Resisted Extension   Hold tubing in right hand, arm forward. Pull arm back, elbow straight. Repeat _10___ times per set. Do 2-3____ sets per session. Do 1____ sessions per day.  ROM: Saw (Protraction / Retraction)    Reach right arm out in front, then pull arm back, pinching shoulder blades down and back. 1 # weight Repeat __10__ times per set. Do __2-3__ sets per session. Do __1__ sessions per day.    Progressive Resisted: External Rotation (Side-Lying)    Holding _1__ pound weight, towel under arm, raise right forearm toward ceiling. Keep elbow bent and at side. Repeat __10__ times per set. Do __2-3__ sets per session. Do __1__ sessions per day.

## 2018-05-10 NOTE — Therapy (Signed)
[place], [place], [address] Phone: [phone]   Fax:  [phone]  Physical Therapy Treatment  Patient Details  Name: Terri Nicholson MRN: [id number] Date of Birth: [birth year] Referring Provider: Dr Tania Ade   Encounter Date: 05/10/2018  PT End of Session - 05/10/18 1101    Visit Number  8    Number of Visits  20    Date for PT Re-Evaluation  06/02/18    PT Start Time  1100    PT Stop Time  1159    PT Time Calculation (min)  59 min    Activity Tolerance  Patient tolerated treatment well       Past Medical History:  Diagnosis Date  . Arthritis   . Asthma   . Diabetes mellitus without complication ([place])   . Hypertension   . IFG (impaired fasting glucose)     Past Surgical History:  Procedure Laterality Date  . CARDIAC SURGERY    . CESAREAN SECTION    . EYE SURGERY    . HERNIA REPAIR      There were no vitals filed for this visit.  Subjective Assessment - 05/10/18 1101    Subjective  Patient reports that she feels stiff - worked on exercises in the water. She was able to pick up a coffee cup at breakfast with her Lt hand/arm and it was OK. Gradually using her Lt UE for more functional activities.     Currently in Pain?  Yes    Pain Score  1     Pain Location  Shoulder    Pain Orientation  Left;Posterior    Pain Descriptors / Indicators  Aching;Tightness    Pain Type  Chronic pain;Surgical pain    Pain Onset  More than a month ago    Pain Frequency  Intermittent    Aggravating Factors   movement; prolonged postures; lifting    Pain Relieving Factors  ice; compression          [facility] PT Assessment - 05/10/18 0001      Assessment   Medical Diagnosis  Lt RCR - supraspinatus; infraspinatus; labral repair     Referring Provider  Dr Tania Ade    Onset Date/Surgical Date  02/22/18 injury 11/26/17    Hand Dominance  Right    Next MD Visit  05/12/18      Sensation    Additional Comments  WFL's per pt report       Posture/Postural Control   Posture Comments  head forward; shoulders rounded and elevated; head of the humerus anterior in orientation; scapulae abducted and rotated along the thoracic wall       AROM   Right Shoulder Extension  55 Degrees    Right Shoulder Flexion  153 Degrees    Right Shoulder ABduction  157 Degrees    Right Shoulder Internal Rotation  30 Degrees    Right Shoulder External Rotation  70 Degrees    Left Shoulder Extension  51 Degrees    Left Shoulder Flexion  125 Degrees    Left Shoulder ABduction  142 Degrees in scapular plane    Left Shoulder Internal Rotation  32 Degrees    Left Shoulder External Rotation  67 Degrees      PROM   Left Shoulder Extension  65 Degrees    Left Shoulder Flexion  150 Degrees    Left Shoulder ABduction  151 Degrees in scapular plane    Left  Shoulder Internal Rotation  64 Degrees in scapular plane     Left Shoulder External Rotation  64 Degrees in scapular plane      Palpation   Palpation comment  continued muscular tightness through pecs; upper trap; leveator; deltoid; biceps Lt > Rt                    [facility] Adult PT Treatment/Exercise - 05/10/18 0001      Shoulder Exercises: Sidelying   External Rotation  AROM;Left;10 reps;Weights    External Rotation Weight (lbs)  1#      Shoulder Exercises: Standing   ABduction  Strengthening;Weights;10 reps scaption - back along noodle x10- VC for correct form     Shoulder ABduction Weight (lbs)  1# verbal cue for scapular control    Extension  Strengthening;Right;Left;10 reps;Theraband    Theraband Level (Shoulder Extension)  Level 2 (Red)    Extension Weight (lbs)  bent forward shoulder extension - 1# x 10     Row  Strengthening;Right;Left;10 reps;Theraband    Theraband Level (Shoulder Row)  Level 2 (Red)    Retraction  Strengthening;Right;Left;10 reps;Theraband    Theraband Level (Shoulder Retraction)  Level 1 (Yellow)     Other Standing Exercises  scap squeeze 10 sec x 10 with noodle; L's; W's        Shoulder Exercises: Pulleys   Flexion  -- 10 sec hold x 5 reps     Scaption  -- 10 sec hold x 10 reps       Shoulder Exercises: ROM/Strengthening   Pendulum  30 CW/30 CCW repeated 3 times during exercise       Electrical Stimulation   Electrical Stimulation Location  Lt shoulder     Electrical Stimulation Action  IFC    Electrical Stimulation Parameters  to tolerance    Electrical Stimulation Goals  Pain;Tone      Vasopneumatic   Number Minutes Vasopneumatic   15 minutes    Vasopnuematic Location   Shoulder Lt     Vasopneumatic Pressure  Low    Vasopneumatic Temperature   34 deg       Manual Therapy   Manual therapy comments  pt supine     Joint Mobilization  Lt GH joint gently circumduction     Soft tissue mobilization  pecs; upper trap; leveator; periscapular musculature; biceps; deltoid Lt shoulder     Myofascial Release  pecs into anterior shoulder     Scapular Mobilization  scapular protraction and inferior glide    Passive ROM  PROM Lt shoulder - flexion, scaption; ER; extension pt in supine              PT Education - 05/10/18 1132    Education provided  Yes    Education Details  HEP     Person(s) Educated  Patient    Methods  Explanation;Demonstration;Tactile cues;Verbal cues;Handout    Comprehension  Verbalized understanding;Returned demonstration;Verbal cues required;Tactile cues required       PT Short Term Goals - 04/26/18 1112      PT SHORT TERM GOAL #1   Title  Decrease pain Lt shoulder to 0/10 to 2/10 for rest and no more than 5/10 with exercise 04/21/18    Time  6    Period  Weeks    Status  Partially Met      PT SHORT TERM GOAL #2   Title  Improve posture and alignment with patient to demonstrate improved upright posture with posterior shoulder  girdle engaged 04/21/18    Time  6    Period  Weeks    Status  On-going      PT SHORT TERM GOAL #3   Title  Lt shoulder  PROM to 90 deg flexion and 20 deg ER - progressing with ROM per protocol 04/21/18    Time  6    Period  Weeks    Status  Achieved        PT Long Term Goals - 04/26/18 1112      PT LONG TERM GOAL #1   Title  AROM Lt shoulder =/> AROM Rt shoulder 06/02/18    Time  12    Period  Weeks    Status  On-going      PT LONG TERM GOAL #2   Title  4-/5 to 4+/5 strength Lt shoulder 06/02/18    Time  12    Period  Weeks    Status  On-going      PT LONG TERM GOAL #3   Title  independent in HEP 06/02/18    Time  12    Period  Weeks    Status  On-going      PT LONG TERM GOAL #4   Title  improve FOTO =/> 48% limited 05/02/18    Time  12    Period  Weeks    Status  On-going            Plan - 05/10/18 1200    Clinical Impression Statement  Terri Nicholson continues to progres gradually with shoulder rehab. She has less pain overall but continues to have some intermittent pain or discomfort and difficulty sleeping. PROM is increasing and AROM assessed today. Terri Nicholson added TB and weights for exercises today with continued VC for scapular control/avoiding hiking. Vaso and estim assist with pain managemtn. Terri Nicholson is progressing well with rehab protocol per MD. Treatment has been limited to 1x/wk due to insurance limits. She has currently used 8/20 visits.     Rehab Potential  Good    PT Frequency  2x / week    PT Duration  12 weeks    PT Treatment/Interventions  Patient/family education;ADLs/Self Care Home Management;Cryotherapy;Electrical Stimulation;Iontophoresis 46m/ml Dexamethasone;Moist Heat;Ultrasound;Dry needling;Manual techniques;Neuromuscular re-education;Therapeutic activities;Therapeutic exercise    PT Next Visit Plan  review HEP; add manual work and PROM Lt shoudler; continue with postural correction; progression of rehab per protocol     Consulted and Agree with Plan of Care  Patient       Patient will benefit from skilled therapeutic intervention in order to improve the following  deficits and impairments:  Postural dysfunction, Improper body mechanics, Pain, Increased fascial restricitons, Increased muscle spasms, Decreased mobility, Decreased range of motion, Decreased strength, Decreased activity tolerance, Impaired UE functional use  Visit Diagnosis: Acute pain of left shoulder  Muscle weakness (generalized)  Stiffness of left shoulder joint  Other symptoms and signs involving the musculoskeletal system  Abnormal posture  Stiffness of left shoulder, not elsewhere classified     Problem List Patient Active Problem List   Diagnosis Date Noted  . Complete rotator cuff tear 01/05/2018  . Palpitations 03/04/2017  . DIABETES MELLITUS, TYPE II 09/19/2007  . HYPERTENSION 09/19/2007    Terri Nicholson PNilda SimmerPT, MPH  05/10/2018, 12:04 PM  [facility] [address]Phone: [phone]  Fax:  [phone] Name: MSeana UnderwoodMRN: [id number] of Birth: 930-Jun-1957

## 2018-05-17 ENCOUNTER — Ambulatory Visit: Payer: Managed Care, Other (non HMO) | Admitting: Rehabilitative and Restorative Service Providers"

## 2018-05-17 ENCOUNTER — Encounter: Payer: Self-pay | Admitting: Rehabilitative and Restorative Service Providers"

## 2018-05-17 DIAGNOSIS — M25512 Pain in left shoulder: Secondary | ICD-10-CM

## 2018-05-17 DIAGNOSIS — M6281 Muscle weakness (generalized): Secondary | ICD-10-CM | POA: Diagnosis not present

## 2018-05-17 DIAGNOSIS — M25612 Stiffness of left shoulder, not elsewhere classified: Secondary | ICD-10-CM | POA: Diagnosis not present

## 2018-05-17 DIAGNOSIS — R29898 Other symptoms and signs involving the musculoskeletal system: Secondary | ICD-10-CM | POA: Diagnosis not present

## 2018-05-17 DIAGNOSIS — R293 Abnormal posture: Secondary | ICD-10-CM

## 2018-05-17 NOTE — Therapy (Signed)
[place], [place], [address] Phone: [phone]   Fax:  [phone]  Physical Therapy Treatment  Patient Details  Name: Terri Nicholson MRN: [id number] Date of Birth: Mar 26, 1956 Referring Provider: Dr Tania Ade   Encounter Date: 05/17/2018  PT End of Session - 05/17/18 1150    Visit Number  9    Number of Visits  20    Date for PT Re-Evaluation  06/02/18    PT Start Time  1147    PT Stop Time  1249    PT Time Calculation (min)  62 min    Activity Tolerance  Patient tolerated treatment well       Past Medical History:  Diagnosis Date  . Arthritis   . Asthma   . Diabetes mellitus without complication ([place])   . Hypertension   . IFG (impaired fasting glucose)     Past Surgical History:  Procedure Laterality Date  . CARDIAC SURGERY    . CESAREAN SECTION    . EYE SURGERY    . HERNIA REPAIR      There were no vitals filed for this visit.  Subjective Assessment - 05/17/18 1150    Subjective  Saw MD Friday - he was pleased with her progress. MD OK'ed driving and lifting up to 10 # - Patient irritated shoulder with activities over the weekend.     Currently in Pain?  Yes    Pain Score  4     Pain Location  Shoulder    Pain Orientation  Left;Posterior    Pain Type  Chronic pain;Surgical pain                       [facility] Adult PT Treatment/Exercise - 05/17/18 0001      Shoulder Exercises: Standing   ABduction  Strengthening;Weights;10 reps scaption - back along noodle x10- VC for correct form     Shoulder ABduction Weight (lbs)  1# verbal cue for scapular control    Extension  Strengthening;Right;Left;10 reps;Theraband    Theraband Level (Shoulder Extension)  Level 2 (Red)    Extension Weight (lbs)  bent forward shoulder extension - 1# x 10     Row  Strengthening;Right;Left;10 reps;Theraband    Theraband Level (Shoulder Row)  Level 3 (Green)    Row Limitations   bow and arrow red TB x 10     Retraction  Strengthening;Right;Left;10 reps;Theraband    Theraband Level (Shoulder Retraction)  Level 2 (Red)    Other Standing Exercises  scap squeeze 10 sec x 10 with noodle; L's; W's        Shoulder Exercises: Therapy Ball   Other Therapy Ball Exercises  scapular depression 5 sec hold x 10     Other Therapy Ball Exercises  PT moving ball in various directions - with scapular depression - small ball on wall flex/ext; horiz ab/add' circles x ~ 10 each       Acupuncturist Location  Lt shoulder     Electrical Stimulation Action  IFC    Electrical Stimulation Parameters  to tolerance    Electrical Stimulation Goals  Pain;Tone      Vasopneumatic   Number Minutes Vasopneumatic   15 minutes    Vasopnuematic Location   Shoulder Lt     Vasopneumatic Pressure  Low    Vasopneumatic Temperature   34 deg       Manual Therapy  Manual therapy comments  pt supine     Joint Mobilization  Lt GH joint gently circumduction     Soft tissue mobilization  pecs; upper trap; leveator; periscapular musculature; biceps; deltoid Lt shoulder     Myofascial Release  pecs into anterior shoulder     Scapular Mobilization  scapular protraction and inferior glide    Passive ROM  PROM Lt shoulder - flexion, scaption; ER; extension pt in supine              PT Education - 05/17/18 1301    Education provided  Yes    Education Details  HEP ball work     [employer]) Educated  Patient    Methods  Explanation;Demonstration;Tactile cues;Verbal cues;Handout    Comprehension  Verbalized understanding;Returned demonstration;Verbal cues required;Tactile cues required       PT Short Term Goals - 04/26/18 1112      PT SHORT TERM GOAL #1   Title  Decrease pain Lt shoulder to 0/10 to 2/10 for rest and no more than 5/10 with exercise 04/21/18    Time  6    Period  Weeks    Status  Partially Met      PT SHORT TERM GOAL #2   Title  Improve posture  and alignment with patient to demonstrate improved upright posture with posterior shoulder girdle engaged 04/21/18    Time  6    Period  Weeks    Status  On-going      PT SHORT TERM GOAL #3   Title  Lt shoulder PROM to 90 deg flexion and 20 deg ER - progressing with ROM per protocol 04/21/18    Time  6    Period  Weeks    Status  Achieved        PT Long Term Goals - 04/26/18 1112      PT LONG TERM GOAL #1   Title  AROM Lt shoulder =/> AROM Rt shoulder 06/02/18    Time  12    Period  Weeks    Status  On-going      PT LONG TERM GOAL #2   Title  4-/5 to 4+/5 strength Lt shoulder 06/02/18    Time  12    Period  Weeks    Status  On-going      PT LONG TERM GOAL #3   Title  independent in HEP 06/02/18    Time  12    Period  Weeks    Status  On-going      PT LONG TERM GOAL #4   Title  improve FOTO =/> 48% limited 05/02/18    Time  12    Period  Weeks    Status  On-going            Plan - 05/17/18 1304    Clinical Impression Statement  Progressing well with good report from MD. Added scapular depression and stabilization with exercises balls. Progressing well     Rehab Potential  Good    PT Frequency  2x / week    PT Duration  12 weeks    PT Treatment/Interventions  Patient/family education;ADLs/Self Care Home Management;Cryotherapy;Electrical Stimulation;Iontophoresis '4mg'$ /ml Dexamethasone;Moist Heat;Ultrasound;Dry needling;Manual techniques;Neuromuscular re-education;Therapeutic activities;Therapeutic exercise    PT Next Visit Plan  review HEP; add manual work and PROM Lt shoudler; continue with postural correction; progression of rehab per protocol     Consulted and Agree with Plan of Care  Patient       Patient will  benefit from skilled therapeutic intervention in order to improve the following deficits and impairments:  Postural dysfunction, Improper body mechanics, Pain, Increased fascial restricitons, Increased muscle spasms, Decreased mobility, Decreased range of  motion, Decreased strength, Decreased activity tolerance, Impaired UE functional use  Visit Diagnosis: Acute pain of left shoulder  Muscle weakness (generalized)  Stiffness of left shoulder joint  Other symptoms and signs involving the musculoskeletal system  Abnormal posture  Stiffness of left shoulder, not elsewhere classified     Problem List Patient Active Problem List   Diagnosis Date Noted  . Complete rotator cuff tear 01/05/2018  . Palpitations 03/04/2017  . DIABETES MELLITUS, TYPE II 09/19/2007  . HYPERTENSION 09/19/2007    Celyn Nilda Simmer PT, MPH  05/17/2018, 1:07 PM  [facility] [place], [place], [address] Phone: [phone]   Fax:  [phone]  Name: Angellina Ferdinand MRN: [id number] Date of Birth: 1955/11/20

## 2018-05-24 ENCOUNTER — Ambulatory Visit: Payer: Managed Care, Other (non HMO) | Admitting: Rehabilitative and Restorative Service Providers"

## 2018-05-24 ENCOUNTER — Encounter: Payer: Self-pay | Admitting: Rehabilitative and Restorative Service Providers"

## 2018-05-24 DIAGNOSIS — R29898 Other symptoms and signs involving the musculoskeletal system: Secondary | ICD-10-CM | POA: Diagnosis not present

## 2018-05-24 DIAGNOSIS — M6281 Muscle weakness (generalized): Secondary | ICD-10-CM | POA: Diagnosis not present

## 2018-05-24 DIAGNOSIS — M25612 Stiffness of left shoulder, not elsewhere classified: Secondary | ICD-10-CM | POA: Diagnosis not present

## 2018-05-24 DIAGNOSIS — M25512 Pain in left shoulder: Secondary | ICD-10-CM

## 2018-05-24 NOTE — Therapy (Signed)
[facility] [address] [place], [place], [address] Phone: [phone]   Fax:  [phone]  Physical Therapy Treatment  Patient Details  Name: Terri MoraleMichele Anne Currie MRN: [id number] Date of Birth: 08-26-1956 Referring Provider: Dr Jones BroomJustin Chandler    Encounter Date: 05/24/2018  PT End of Session - 05/24/18 1142    Visit Number  10    Number of Visits  20    Date for PT Re-Evaluation  06/02/18    PT Start Time  1145    PT Stop Time  1246    PT Time Calculation (min)  61 min    Activity Tolerance  Patient tolerated treatment well       Past Medical History:  Diagnosis Date  . Arthritis   . Asthma   . Diabetes mellitus without complication (HCC)   . Hypertension   . IFG (impaired fasting glucose)     Past Surgical History:  Procedure Laterality Date  . CARDIAC SURGERY    . CESAREAN SECTION    . EYE SURGERY    . HERNIA REPAIR      There were no vitals filed for this visit.  Subjective Assessment - 05/24/18 1143    Subjective  Patient reports that she has some discomfort continued in the Lt shoulder. Moved one of her daughters to college last week and that was a busy day - calmed down.     Currently in Pain?  Yes    Pain Score  1     Pain Location  Shoulder    Pain Orientation  Left;Posterior    Pain Descriptors / Indicators  Aching;Tightness    Pain Type  Chronic pain         [facility] PT Assessment - 05/24/18 0001      Assessment   Medical Diagnosis  Lt RCR - supraspinatus; infraspinatus; labral repair     Referring Provider  Dr Jones BroomJustin Chandler     Onset Date/Surgical Date  02/22/18   injury 11/26/17   Hand Dominance  Right    Next MD Visit  06/19/18      Observation/Other Assessments   Focus on Therapeutic Outcomes (FOTO)   49% limitation       AROM   Left Shoulder Extension  46 Degrees    Left Shoulder Flexion  135 Degrees    Left Shoulder ABduction  145 Degrees    Left Shoulder Internal Rotation   38 Degrees    Left Shoulder External Rotation  76 Degrees                   [facility] Adult PT Treatment/Exercise - 05/24/18 0001      Shoulder Exercises: Therapy Ball   Flexion  Left;Right   stretch into flexion stepping under the large ball    Other Therapy Ball Exercises  scapular depression 5 sec hold x 10     Other Therapy Ball Exercises  PT moving ball in various directions - with scapular depression - small ball on wall flex/ext; horiz ab/add' circles x ~ 10 each; bouncing small ball on wall x 20-30 sec x 3 reps       Shoulder Exercises: Stretch   Internal Rotation Stretch  3 reps   10 sec standing with strap    Table Stretch - Flexion  5 reps;10 seconds    Other Shoulder Stretches  3 way doorway stretch 30 sec x 3 each position       Electrical Stimulation  Electrical Stimulation Location  Lt shoulder     Electrical Stimulation Action  IFC    Electrical Stimulation Parameters  to toleracne    Electrical Stimulation Goals  Pain;Tone      Vasopneumatic   Number Minutes Vasopneumatic   15 minutes    Vasopnuematic Location   Shoulder   Lt    Vasopneumatic Pressure  Low    Vasopneumatic Temperature   34 deg              PT Education - 05/24/18 1215    Education Details  HEP     Person(s) Educated  Patient    Methods  Explanation;Demonstration;Tactile cues;Verbal cues;Handout    Comprehension  Verbalized understanding;Returned demonstration;Verbal cues required;Tactile cues required       PT Short Term Goals - 05/24/18 1153      PT SHORT TERM GOAL #1   Title  Decrease pain Lt shoulder to 0/10 to 2/10 for rest and no more than 5/10 with exercise 04/21/18    Time  6    Period  Weeks    Status  Achieved      PT SHORT TERM GOAL #2   Title  Improve posture and alignment with patient to demonstrate improved upright posture with posterior shoulder girdle engaged 04/21/18    Time  6    Period  Weeks    Status  Achieved      PT SHORT TERM GOAL #3   Title   Lt shoulder PROM to 90 deg flexion and 20 deg ER - progressing with ROM per protocol 04/21/18    Time  6    Period  Weeks    Status  Achieved        PT Long Term Goals - 05/24/18 1143      PT LONG TERM GOAL #1   Title  AROM Lt shoulder =/> AROM Rt shoulder 06/02/18    Time  12    Period  Weeks    Status  On-going      PT LONG TERM GOAL #2   Title  4-/5 to 4+/5 strength Lt shoulder 06/02/18    Time  12    Period  Weeks    Status  On-going      PT LONG TERM GOAL #3   Title  independent in HEP 06/02/18    Time  12    Period  Weeks    Status  On-going      PT LONG TERM GOAL #4   Title  improve FOTO =/> 48% limited 05/02/18    Time  12    Period  Weeks    Status  On-going            Plan - 05/24/18 1151    Clinical Impression Statement  Patient continues to increase functional activity level and is using her Lt UE for more activities at home. She is adding stabilization and strengthening exercises in the clinic. Progressing well toward stated goals of function and reach maximum rehab potential.     Rehab Potential  Good    Clinical Impairments Affecting Rehab Potential  -    PT Frequency  2x / week    PT Duration  12 weeks    PT Treatment/Interventions  Patient/family education;ADLs/Self Care Home Management;Cryotherapy;Electrical Stimulation;Iontophoresis 4mg /ml Dexamethasone;Moist Heat;Ultrasound;Dry needling;Manual techniques;Neuromuscular re-education;Therapeutic activities;Therapeutic exercise    PT Next Visit Plan  review HEP; add manual work and PROM Lt shoudler; continue with postural correction; progression of rehab per protocol  Consulted and Agree with Plan of Care  Patient       Patient will benefit from skilled therapeutic intervention in order to improve the following deficits and impairments:  Postural dysfunction, Improper body mechanics, Pain, Increased fascial restricitons, Increased muscle spasms, Decreased mobility, Decreased range of motion,  Decreased strength, Decreased activity tolerance, Impaired UE functional use  Visit Diagnosis: Acute pain of left shoulder  Muscle weakness (generalized)  Stiffness of left shoulder joint  Other symptoms and signs involving the musculoskeletal system     Problem List Patient Active Problem List   Diagnosis Date Noted  . Complete rotator cuff tear 01/05/2018  . Palpitations 03/04/2017  . DIABETES MELLITUS, TYPE II 09/19/2007  . HYPERTENSION 09/19/2007    Marny Smethers Rober Minion PT, MPH  05/24/2018, 12:45 PM  [facility] [address] [place], [place], [address] Phone: [phone]   Fax:  [phone]  Name: Somaly Marteney MRN: [id number] Date of Birth: 10-14-1955

## 2018-05-31 ENCOUNTER — Ambulatory Visit: Payer: Managed Care, Other (non HMO) | Admitting: Rehabilitative and Restorative Service Providers"

## 2018-05-31 ENCOUNTER — Encounter: Payer: Self-pay | Admitting: Rehabilitative and Restorative Service Providers"

## 2018-05-31 DIAGNOSIS — R29898 Other symptoms and signs involving the musculoskeletal system: Secondary | ICD-10-CM

## 2018-05-31 DIAGNOSIS — R293 Abnormal posture: Secondary | ICD-10-CM

## 2018-05-31 DIAGNOSIS — M25612 Stiffness of left shoulder, not elsewhere classified: Secondary | ICD-10-CM

## 2018-05-31 DIAGNOSIS — M6281 Muscle weakness (generalized): Secondary | ICD-10-CM

## 2018-05-31 DIAGNOSIS — M25512 Pain in left shoulder: Secondary | ICD-10-CM | POA: Diagnosis not present

## 2018-05-31 NOTE — Therapy (Signed)
[facility] [address] [place] [address] [place], [place], [address] Phone: [phone]   Fax:  [phone]  Physical Therapy Treatment  Patient Details  Name: Terri MoraleMichele Anne Nicholson MRN: [id number] Date of Birth: 19-Oct-1955 Referring Provider: Dr Jones BroomJustin Chandler    Encounter Date: 05/31/2018  PT End of Session - 05/31/18 1156    Visit Number  11    Number of Visits  20    Date for PT Re-Evaluation  06/02/18    PT Start Time  1145    PT Stop Time  1247    PT Time Calculation (min)  62 min    Activity Tolerance  Patient tolerated treatment well       Past Medical History:  Diagnosis Date  . Arthritis   . Asthma   . Diabetes mellitus without complication (HCC)   . Hypertension   . IFG (impaired fasting glucose)     Past Surgical History:  Procedure Laterality Date  . CARDIAC SURGERY    . CESAREAN SECTION    . EYE SURGERY    . HERNIA REPAIR      There were no vitals filed for this visit.  Subjective Assessment - 05/31/18 1156    Subjective  Working on exercises at home. Noted some LBP with some of the exercises lifting overhead at wall.     Currently in Pain?  Yes    Pain Score  1     Pain Location  Shoulder    Pain Orientation  Left;Posterior    Pain Descriptors / Indicators  Aching;Tightness    Pain Type  Chronic pain;Surgical pain         [facility] PT Assessment - 05/31/18 0001      Assessment   Medical Diagnosis  Lt RCR - supraspinatus; infraspinatus; labral repair     Referring Provider  Dr Jones BroomJustin Chandler     Onset Date/Surgical Date  02/22/18   injury 11/26/17   Hand Dominance  Right    Next MD Visit  06/19/18      Palpation   Palpation comment  continued muscular tightness through pecs; upper trap; leveator; deltoid; biceps Lt > Rt                    [facility] Adult PT Treatment/Exercise - 05/31/18 0001      Shoulder Exercises: Standing   Extension Weight (lbs)  bent forward shoulder extension - 2# x  10     Shoulder Elevation Limitations  wall push up x 10     Other Standing Exercises  scap squeeze 10 sec x 10 with noodle; L's; W's      Other Standing Exercises  bent forward row 2# wt x 10 each       Shoulder Exercises: Therapy Ball   Flexion  Left;Right   stretch into flexion stepping under the large ball    Other Therapy Ball Exercises  PT moving ball in various directions - with scapular depression - small ball on wall flex/ext; horiz ab/add' circles x ~ 10 each; bouncing small ball on wall x 20-30 sec x 3 reps       Shoulder Exercises: Stretch   Internal Rotation Stretch  3 reps   10 sec standing with strap    Table Stretch - Flexion  5 reps;10 seconds    Other Shoulder Stretches  3 way doorway stretch 30 sec x 3 each position       Electrical Stimulation   Electrical Stimulation  Location  Lt shoulder     Electrical Stimulation Action  IFC    Electrical Stimulation Parameters  to tolerance    Electrical Stimulation Goals  Pain;Tone      Vasopneumatic   Number Minutes Vasopneumatic   15 minutes    Vasopnuematic Location   Shoulder   Lt    Vasopneumatic Pressure  Low    Vasopneumatic Temperature   34 deg       Manual Therapy   Manual therapy comments  pt sitting     Joint Mobilization  Lt GH joint gently circumduction     Soft tissue mobilization  pecs; upper trap; leveator; periscapular musculature; biceps; deltoid Lt shoulder     Scapular Mobilization  scapular protraction and inferior glide    Passive ROM  PROM Lt shoulder - flexion, scaption; ER; extension pt in supine              PT Education - 05/31/18 1227    Education provided  Yes    Education Details  HEP     Person(s) Educated  Patient    Methods  Explanation;Demonstration;Tactile cues;Verbal cues;Handout    Comprehension  Verbalized understanding;Returned demonstration;Verbal cues required;Tactile cues required       PT Short Term Goals - 05/24/18 1153      PT SHORT TERM GOAL #1   Title   Decrease pain Lt shoulder to 0/10 to 2/10 for rest and no more than 5/10 with exercise 04/21/18    Time  6    Period  Weeks    Status  Achieved      PT SHORT TERM GOAL #2   Title  Improve posture and alignment with patient to demonstrate improved upright posture with posterior shoulder girdle engaged 04/21/18    Time  6    Period  Weeks    Status  Achieved      PT SHORT TERM GOAL #3   Title  Lt shoulder PROM to 90 deg flexion and 20 deg ER - progressing with ROM per protocol 04/21/18    Time  6    Period  Weeks    Status  Achieved        PT Long Term Goals - 05/24/18 1143      PT LONG TERM GOAL #1   Title  AROM Lt shoulder =/> AROM Rt shoulder 06/02/18    Time  12    Period  Weeks    Status  On-going      PT LONG TERM GOAL #2   Title  4-/5 to 4+/5 strength Lt shoulder 06/02/18    Time  12    Period  Weeks    Status  On-going      PT LONG TERM GOAL #3   Title  independent in HEP 06/02/18    Time  12    Period  Weeks    Status  On-going      PT LONG TERM GOAL #4   Title  improve FOTO =/> 48% limited 05/02/18    Time  12    Period  Weeks    Status  On-going            Plan - 05/31/18 1256    Clinical Impression Statement  Progressing well with ther exercise. Patient demonstrates imporved scapular stability and control with strengthening exercises. She has some Lt > Rt LBP with overhead door/wall exercises. Added stretch for LB to address muscular tightness. Progressing well toward stated goals of therapy.  Rehab Potential  Good    PT Frequency  2x / week    PT Duration  12 weeks    PT Treatment/Interventions  Patient/family education;ADLs/Self Care Home Management;Cryotherapy;Electrical Stimulation;Iontophoresis 4mg /ml Dexamethasone;Moist Heat;Ultrasound;Dry needling;Manual techniques;Neuromuscular re-education;Therapeutic activities;Therapeutic exercise    PT Next Visit Plan  review HEP; add manual work and PROM Lt shoudler; continue with postural correction;  progression of rehab per protocol     Consulted and Agree with Plan of Care  Patient       Patient will benefit from skilled therapeutic intervention in order to improve the following deficits and impairments:  Postural dysfunction, Improper body mechanics, Pain, Increased fascial restricitons, Increased muscle spasms, Decreased mobility, Decreased range of motion, Decreased strength, Decreased activity tolerance, Impaired UE functional use  Visit Diagnosis: Acute pain of left shoulder  Muscle weakness (generalized)  Stiffness of left shoulder joint  Other symptoms and signs involving the musculoskeletal system  Abnormal posture     Problem List Patient Active Problem List   Diagnosis Date Noted  . Complete rotator cuff tear 01/05/2018  . Palpitations 03/04/2017  . DIABETES MELLITUS, TYPE II 09/19/2007  . HYPERTENSION 09/19/2007    Nobuo Nunziata Rober MinionP Juergen Hardenbrook PT, MPH 05/31/2018, 1:11 PM  [facility] [address] [place], [place], [address] Phone: [phone]   Fax:  [phone]  Name: Terri MoraleMichele Anne Nicholson MRN: [id number] Date of Birth: 1956/02/12

## 2018-05-31 NOTE — Patient Instructions (Addendum)
Internal Rotator Cuff Stretch, Standing towel or pillow case    Stand holding club behind body, one arm above head, other arm bent behind back. With upper hand, pull gently upward. Hold _10__ seconds. Repeat _3-5__ times per session. Do _2__ sessions per day.  Wall Push-Up: Double Arm    Stand __2_ feet from wall with both hands on wall. Perform a push-up. Repeat __10_ times per set.  Do __1-2_ sets per session.    Progressive Resisted: Extension (Standing) propped against table     Holding __2__ pound weights, raise arms back, keeping elbows straight. Repeat __10__ times per set. Do _1-2___ sets per session. Do __1__ sessions per day.    Progressive Resisted: Abduction (Standing) THUMBS up toward ceiling     Holding __2__ pound weights, raise arms out from sides. Repeat __10__ times per set. Do __1-3__ sets per session. Do __1__ sessions per day.

## 2018-06-07 ENCOUNTER — Encounter: Payer: Managed Care, Other (non HMO) | Admitting: Rehabilitative and Restorative Service Providers"

## 2018-06-15 ENCOUNTER — Encounter: Payer: Self-pay | Admitting: Rehabilitative and Restorative Service Providers"

## 2018-06-15 ENCOUNTER — Ambulatory Visit: Payer: Managed Care, Other (non HMO) | Admitting: Rehabilitative and Restorative Service Providers"

## 2018-06-15 DIAGNOSIS — M6281 Muscle weakness (generalized): Secondary | ICD-10-CM

## 2018-06-15 DIAGNOSIS — R293 Abnormal posture: Secondary | ICD-10-CM

## 2018-06-15 DIAGNOSIS — M25612 Stiffness of left shoulder, not elsewhere classified: Secondary | ICD-10-CM | POA: Diagnosis not present

## 2018-06-15 DIAGNOSIS — R29898 Other symptoms and signs involving the musculoskeletal system: Secondary | ICD-10-CM | POA: Diagnosis not present

## 2018-06-15 DIAGNOSIS — M25512 Pain in left shoulder: Secondary | ICD-10-CM | POA: Diagnosis not present

## 2018-06-15 NOTE — Therapy (Signed)
[facility] [address] [place] [place], [place], [address] Phone: [phone]   Fax:  [phone]  Physical Therapy Treatment  Patient Details  Name: Terri Nicholson MRN: [id number] Date of Birth: June 02, 1956 Referring Provider: Dr Jones Broom    Encounter Date: 06/15/2018  PT End of Session - 06/15/18 1108    Visit Number  12    Number of Visits  20    Date for PT Re-Evaluation  07/22/18    PT Start Time  1107    PT Stop Time  1204    PT Time Calculation (min)  57 min    Activity Tolerance  Patient tolerated treatment well       Past Medical History:  Diagnosis Date  . Arthritis   . Asthma   . Diabetes mellitus without complication (HCC)   . Hypertension   . IFG (impaired fasting glucose)     Past Surgical History:  Procedure Laterality Date  . CARDIAC SURGERY    . CESAREAN SECTION    . EYE SURGERY    . HERNIA REPAIR      There were no vitals filed for this visit.  Subjective Assessment - 06/15/18 1111    Subjective  Patient reports that she is continuing to work on her exercises at home. She is working on the [employer] with pillowcase and that really hurts.     Currently in Pain?  No/denies                       [facility] Adult PT Treatment/Exercise - 06/15/18 0001      Shoulder Exercises: Pulleys   Flexion  2 minutes    Scaption  2 minutes      Shoulder Exercises: Therapy Ball   Flexion  Left;Right   stretch into flexion stepping under the large ball    Flexion Limitations  moving ball up and down - side to side overhead     Other Therapy Ball Exercises  scapular depression 5 sec hold x 10     Other Therapy Ball Exercises  PT moving ball in various directions - with scapular depression - small ball on wall flex/ext; horiz ab/add' circles x ~ 10 each; bouncing small ball on wall x 20-30 sec x 3 reps       Shoulder Exercises: ROM/Strengthening   UBE (Upper Arm Bike)  L2 x 4 min alt fwd/back  each min       Shoulder Exercises: Stretch   Internal Rotation Stretch  5 reps   10 sec standing with strap    Other Shoulder Stretches  3 way doorway stretch 30 sec x 3 each position       Electrical Stimulation   Electrical Stimulation Location  Lt shoulder     Electrical Stimulation Action  IFC    Electrical Stimulation Parameters  to tolerance    Electrical Stimulation Goals  Pain;Tone      Vasopneumatic   Number Minutes Vasopneumatic   15 minutes    Vasopnuematic Location   Shoulder   Lt    Vasopneumatic Pressure  Low    Vasopneumatic Temperature   34 deg       Manual Therapy   Manual therapy comments  pt supine and Rt sidelying     Joint Mobilization  Lt GH joint gently circumduction     Soft tissue mobilization  pecs; upper trap; leveator; periscapular musculature; biceps; deltoid Lt shoulder     Scapular  Mobilization  scapular protraction and inferior glide    Passive ROM  PROM Lt shoulder - flexion, scaption; ER; extension pt in supine; abduction with pt sidelying              PT Education - 06/15/18 1217    Education provided  Yes    Education Details  HEP supine with hands crossed behind head     Person(s) Educated  Patient    Methods  Explanation;Demonstration;Tactile cues;Verbal cues;Handout    Comprehension  Verbalized understanding;Returned demonstration;Verbal cues required;Tactile cues required       PT Short Term Goals - 05/24/18 1153      PT SHORT TERM GOAL #1   Title  Decrease pain Lt shoulder to 0/10 to 2/10 for rest and no more than 5/10 with exercise 04/21/18    Time  6    Period  Weeks    Status  Achieved      PT SHORT TERM GOAL #2   Title  Improve posture and alignment with patient to demonstrate improved upright posture with posterior shoulder girdle engaged 04/21/18    Time  6    Period  Weeks    Status  Achieved      PT SHORT TERM GOAL #3   Title  Lt shoulder PROM to 90 deg flexion and 20 deg ER - progressing with ROM per protocol  04/21/18    Time  6    Period  Weeks    Status  Achieved        PT Long Term Goals - 06/15/18 1109      PT LONG TERM GOAL #1   Title  AROM Lt shoulder =/> AROM Rt shoulder 07/22/18    Time  18    Period  Weeks    Status  Revised      PT LONG TERM GOAL #2   Title  4-/5 to 4+/5 strength Lt shoulder 07/22/18    Time  18    Period  Weeks    Status  Revised      PT LONG TERM GOAL #3   Title  independent in HEP 07/22/18    Time  18    Period  Weeks    Status  Revised      PT LONG TERM GOAL #4   Title  improve FOTO =/> 48% limited 07/22/18    Time  18    Period  Weeks    Status  Revised            Plan - 06/15/18 1218    Clinical Impression Statement  Continued progress with shoulder rehab. patient is progresing with functional activities in the home. She continues to have muscular tightness and end range joint tightness Rt shoulder girdle. Progressing gradually with shoulder rehab     Rehab Potential  Good    PT Frequency  1x / week   18 weeks    PT Duration  Other (comment)    PT Treatment/Interventions  Patient/family education;ADLs/Self Care Home Management;Cryotherapy;Electrical Stimulation;Iontophoresis 4mg /ml Dexamethasone;Moist Heat;Ultrasound;Dry needling;Manual techniques;Neuromuscular re-education;Therapeutic activities;Therapeutic exercise    PT Next Visit Plan  review HEP; add manual work and PROM Lt shoudler; continue with postural correction; progression of rehab per protocol     Consulted and Agree with Plan of Care  Patient       Patient will benefit from skilled therapeutic intervention in order to improve the following deficits and impairments:  Postural dysfunction, Improper body mechanics, Pain, Increased fascial restricitons, Increased  muscle spasms, Decreased mobility, Decreased range of motion, Decreased strength, Decreased activity tolerance, Impaired UE functional use  Visit Diagnosis: Acute pain of left shoulder - Plan: PT plan of care  cert/re-cert  Muscle weakness (generalized) - Plan: PT plan of care cert/re-cert  Stiffness of left shoulder joint - Plan: PT plan of care cert/re-cert  Other symptoms and signs involving the musculoskeletal system - Plan: PT plan of care cert/re-cert  Abnormal posture - Plan: PT plan of care cert/re-cert  Stiffness of left shoulder, not elsewhere classified - Plan: PT plan of care cert/re-cert     Problem List Patient Active Problem List   Diagnosis Date Noted  . Complete rotator cuff tear 01/05/2018  . Palpitations 03/04/2017  . DIABETES MELLITUS, TYPE II 09/19/2007  . HYPERTENSION 09/19/2007    Ashtynn Berke Rober Minion PT, MPH  06/15/2018, 12:25 PM  [facility] [address] [place], [place], [address] Phone: [phone]   Fax:  [phone]  Name: Terri Nicholson MRN: [id number] Date of Birth: 22-Feb-1956

## 2018-06-23 ENCOUNTER — Encounter: Payer: Self-pay | Admitting: Rehabilitative and Restorative Service Providers"

## 2018-06-23 ENCOUNTER — Ambulatory Visit (INDEPENDENT_AMBULATORY_CARE_PROVIDER_SITE_OTHER): Payer: Managed Care, Other (non HMO) | Admitting: Rehabilitative and Restorative Service Providers"

## 2018-06-23 DIAGNOSIS — M25512 Pain in left shoulder: Secondary | ICD-10-CM | POA: Diagnosis not present

## 2018-06-23 DIAGNOSIS — R293 Abnormal posture: Secondary | ICD-10-CM

## 2018-06-23 DIAGNOSIS — M6281 Muscle weakness (generalized): Secondary | ICD-10-CM | POA: Diagnosis not present

## 2018-06-23 DIAGNOSIS — M25612 Stiffness of left shoulder, not elsewhere classified: Secondary | ICD-10-CM | POA: Diagnosis not present

## 2018-06-23 DIAGNOSIS — R29898 Other symptoms and signs involving the musculoskeletal system: Secondary | ICD-10-CM | POA: Diagnosis not present

## 2018-06-23 NOTE — Therapy (Addendum)
[facility] [address] [address] [place], [place], [address] Phone: [phone]   Fax:  [phone]  Physical Therapy Treatment  Patient Details  Name: Terri Nicholson MRN: [id number] Date of Birth: [birth year] Referring Provider: Dr Tania Ade    Encounter Date: 06/23/2018  PT End of Session - 06/23/18 1449    Visit Number  13    Number of Visits  20    Date for PT Re-Evaluation  07/22/18    PT Start Time  2800    PT Stop Time  1543    PT Time Calculation (min)  58 min    Activity Tolerance  Patient tolerated treatment well       Past Medical History:  Diagnosis Date  . Arthritis   . Asthma   . Diabetes mellitus without complication ([place])   . Hypertension   . IFG (impaired fasting glucose)     Past Surgical History:  Procedure Laterality Date  . CARDIAC SURGERY    . CESAREAN SECTION    . EYE SURGERY    . HERNIA REPAIR      There were no vitals filed for this visit.  Subjective Assessment - 06/23/18 1451    Subjective  Still tight with reaching behind you r back. Pain with certain movements but not normal functional activities.     Pertinent History  pain in the Rt shoulder; surgeries; AODM     Currently in Pain?  No/denies         [facility] PT Assessment - 06/23/18 0001      Assessment   Medical Diagnosis  Lt RCR - supraspinatus; infraspinatus; labral repair     Onset Date/Surgical Date  02/22/18   injury 11/26/17   Hand Dominance  Right    Next MD Visit  06/24/18      Observation/Other Assessments   Focus on Therapeutic Outcomes (FOTO)   49% limitation       AROM   Right Shoulder Extension  55 Degrees    Right Shoulder Flexion  153 Degrees    Right Shoulder ABduction  157 Degrees    Right Shoulder Internal Rotation  30 Degrees    Right Shoulder External Rotation  70 Degrees    Left Shoulder Extension  55 Degrees    Left Shoulder Flexion  146 Degrees    Left Shoulder ABduction  157 Degrees    Left Shoulder Internal Rotation  38 Degrees   IR in standing thumb to waist    Left Shoulder External Rotation  80 Degrees      PROM   Right/Left Shoulder  --   in scapular plane    Left Shoulder Extension  73 Degrees    Left Shoulder Flexion  160 Degrees    Left Shoulder ABduction  165 Degrees    Left Shoulder Internal Rotation  74 Degrees    Left Shoulder External Rotation  90 Degrees      Strength   Overall Strength Comments  grossly 4+/5 to 5-/5 Rt UE       Palpation   Palpation comment  continued muscular tightness through pecs; upper trap; leveator; deltoid; biceps Lt > Rt                    [facility] Adult PT Treatment/Exercise - 06/23/18 0001      Shoulder Exercises: Standing   ABduction  Strengthening;Weights;10 reps   scaption with scap squeeze limit to ~ 90 deg    Shoulder  ABduction Weight (lbs)  1# verbal cue for scapular control    Extension  Strengthening;Right;Left;10 reps;Theraband    Theraband Level (Shoulder Extension)  Level 3 (Green)    Extension Weight (lbs)  bent forward shoulder extension - 2# x 10     Row  Strengthening;Right;Left;10 reps;Theraband    Theraband Level (Shoulder Row)  Level 3 (Green)    Row Limitations  bow and arrow red TB x 10     Retraction  Strengthening;Right;Left;10 reps;Theraband    Theraband Level (Shoulder Retraction)  Level 2 (Red)    Shoulder Elevation Limitations  wall push up x 10     Other Standing Exercises  scap squeeze 10 sec x 10 with noodle; L's; W's      Other Standing Exercises  bent forward row 2# wt x 10 each       Shoulder Exercises: Pulleys   Flexion  2 minutes    Scaption  2 minutes      Shoulder Exercises: Therapy Ball   Flexion  Left;Right   stretch into flexion stepping under the large ball    Flexion Limitations  moving ball up and down - side to side overhead     Other Therapy Ball Exercises  scapular depression 5 sec hold x 10     Other Therapy Ball Exercises  PT moving ball in various  directions - with scapular depression - small ball on wall flex/ext; horiz ab/add' circles x ~ 10 each; bouncing small ball on wall x 20-30 sec x 3 reps       Shoulder Exercises: ROM/Strengthening   UBE (Upper Arm Bike)  L2 x 4 min alt fwd/back each min       Shoulder Exercises: Stretch   Internal Rotation Stretch  5 reps   10 sec standing with strap    Other Shoulder Stretches  3 way doorway stretch 30 sec x 3 each position       Electrical Stimulation   Electrical Stimulation Location  Lt shoulder     Electrical Stimulation Action  IFC    Electrical Stimulation Parameters  to tolerance    Electrical Stimulation Goals  Pain;Tone      Vasopneumatic   Number Minutes Vasopneumatic   15 minutes    Vasopnuematic Location   Shoulder   Lt    Vasopneumatic Pressure  Low    Vasopneumatic Temperature   34 deg       Manual Therapy   Manual therapy comments  pt supine and Rt sidelying     Joint Mobilization  Lt GH joint gently circumduction     Soft tissue mobilization  pecs; upper trap; leveator; periscapular musculature; biceps; deltoid Lt shoulder     Scapular Mobilization  scapular protraction and inferior glide    Passive ROM  PROM Lt shoulder - flexion, scaption; ER; extension pt in supine; abduction with pt sidelying                PT Short Term Goals - 05/24/18 1153      PT SHORT TERM GOAL #1   Title  Decrease pain Lt shoulder to 0/10 to 2/10 for rest and no more than 5/10 with exercise 04/21/18    Time  6    Period  Weeks    Status  Achieved      PT SHORT TERM GOAL #2   Title  Improve posture and alignment with patient to demonstrate improved upright posture with posterior shoulder girdle engaged 04/21/18  Time  6    Period  Weeks    Status  Achieved      PT SHORT TERM GOAL #3   Title  Lt shoulder PROM to 90 deg flexion and 20 deg ER - progressing with ROM per protocol 04/21/18    Time  6    Period  Weeks    Status  Achieved        PT Long Term Goals -  06/23/18 1450      PT LONG TERM GOAL #1   Title  AROM Lt shoulder =/> AROM Rt shoulder 07/22/18    Time  18    Period  Weeks    Status  Partially Met      PT LONG TERM GOAL #2   Title  4-/5 to 4+/5 strength Lt shoulder 07/22/18    Time  18    Period  Weeks    Status  Achieved      PT LONG TERM GOAL #3   Title  independent in HEP 07/22/18    Time  18    Period  Weeks    Status  Achieved      PT LONG TERM GOAL #4   Title  improve FOTO =/> 48% limited 07/22/18    Time  18    Period  Weeks    Status  Achieved            Plan - 06/23/18 1538    Clinical Impression Statement  Patient reports continued improvement in shoulder - with decreased pain and improved function. She demonstrates continued increase in ROM and mobilty with decreased tightness to palpation through shoulder girdle. She is increasing with resistive exercises in HEP using weight, theraband, balls and body weight to strengthen. Patient is progressing well toward goals of therapy.     Rehab Potential  Good    PT Frequency  1x / week   18 weeks    PT Duration  Other (comment)    PT Treatment/Interventions  Patient/family education;ADLs/Self Care Home Management;Cryotherapy;Electrical Stimulation;Iontophoresis 74m/ml Dexamethasone;Moist Heat;Ultrasound;Dry needling;Manual techniques;Neuromuscular re-education;Therapeutic activities;Therapeutic exercise    PT Next Visit Plan  review HEP; add manual work and PROM Lt shoudler; continue with postural correction; progression of rehab per protocol - note to MD continue as ordered     Consulted and Agree with Plan of Care  Patient       Patient will benefit from skilled therapeutic intervention in order to improve the following deficits and impairments:  Postural dysfunction, Improper body mechanics, Pain, Increased fascial restricitons, Increased muscle spasms, Decreased mobility, Decreased range of motion, Decreased strength, Decreased activity tolerance, Impaired UE  functional use  Visit Diagnosis: Acute pain of left shoulder  Muscle weakness (generalized)  Stiffness of left shoulder joint  Other symptoms and signs involving the musculoskeletal system  Abnormal posture  Stiffness of left shoulder, not elsewhere classified     Problem List Patient Active Problem List   Diagnosis Date Noted  . Complete rotator cuff tear 01/05/2018  . Palpitations 03/04/2017  . DIABETES MELLITUS, TYPE II 09/19/2007  . HYPERTENSION 09/19/2007    Celyn PNilda SimmerPT, MPH  06/23/2018, 3:43 PM  [facility] [address]Phone: [phone]  Fax:  [phone] Name: Terri BenjaminMRN: [id number] of Birth: 9February 24, 1957 PHYSICAL THERAPY DISCHARGE SUMMARY  Visits from Start of Care: 13  Current functional level related to goals / functional outcomes: See progress  note for discharge status. Excellent progress with shoulder rehab. Independent with HEP    Remaining deficits: Needs to continue to work on ROM and Pensions consultant / Equipment: HEP  Plan: Patient agrees to discharge.  Patient goals were met. Patient is being discharged due to meeting the stated rehab goals.  ?????    Celyn P. Helene Kelp PT, MPH 07/05/18 9:22 AM

## 2018-06-30 ENCOUNTER — Encounter: Payer: Self-pay | Admitting: Rehabilitative and Restorative Service Providers"

## 2018-12-13 ENCOUNTER — Encounter: Payer: Managed Care, Other (non HMO) | Attending: Family Medicine | Admitting: Registered"

## 2018-12-13 ENCOUNTER — Encounter: Payer: Self-pay | Admitting: Registered"

## 2018-12-13 DIAGNOSIS — E1165 Type 2 diabetes mellitus with hyperglycemia: Secondary | ICD-10-CM | POA: Insufficient documentation

## 2018-12-13 NOTE — Patient Instructions (Addendum)
[employer] is a testing company you may want to check out if you are thinking about trying dietary supplements Your [employer] membership may be covered, silver sneakers program Water aerobics is a great idea. If you work up to 3-5 times per week 30-45 min can make a difference in helping address insulin resistance. Aim to eat balanced meals and snacks

## 2018-12-13 NOTE — Progress Notes (Signed)
Diabetes Self-Management Education  Visit Type: First/Initial  Appt. Start Time: 1045 Appt. End Time: 1210  12/14/2018  [name], identified by name and date of birth, is a 63 y.o. female with a diagnosis of Diabetes: Type 2.   ASSESSMENT  Height 5' (1.524 m), weight 209 lb 1.6 oz (94.8 kg). Body mass index is 40.84 kg/m.  Pt states she is confused why all the sudden her A1c has jumped from 5.7% to 10% Pt states she has tried several medications including Bydureon, Ozempic. Pt states she stopped all pain meds for her arthritis due to reduced GFR. Pt reports she is doing well on metformin and Januvia. Pt states this morning was her lowest FBG in awhile 135 mg/dL, pt states she also felt nauseous.  Pt reports she has lost weight by staying away from starchy veggies, sweets, using portion control, has increased water intake, tries to eat whole grains bread instead of whiteusing sugar-free options for creamer etc, more fruit and leafy veggies.Pt states she slips in the evening and has a cookie or something else sweet.  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 Pt states she also has 2 special needs children, one living at home as well as a son who is a senior in HS.   Physical Activity: ADLs. Pt states she had rotator cuff surgery late May and had a setback wtih her ability to exercise. Pt states she was exercising 5x week, 45 min until her she had to start wearing a boot to stabilize her ankle.   Patient expressed interest in using supplements. RD educated on the reasons to be cautious with supplements.  Related PMH: PCOS, CKD, TG 249, concerned about family hx CVD, depression (I think patient was relating the depression with her mom's slow decline and passing in 2018)  Diabetes Self-Management Education - 12/13/18 1116      Visit Information   Visit Type  First/Initial      Initial Visit    Diabetes Type  Type 2    Are you currently following a meal plan?  Yes    What type of meal plan do you follow?  diabetic    Are you taking your medications as prescribed?  Yes    Date Diagnosed  2018      Health Coping   How would you rate your overall health?  Good      Psychosocial Assessment   Patient Belief/Attitude about Diabetes  Motivated to manage diabetes    How often do you need to have someone help you when you read instructions, pamphlets, or other written materials from your doctor or pharmacy?  1 - Never    What is the last grade level you completed in school?  Assoc Degree      Complications   Last HgB A1C per patient/outside source  10 %    How often do you check your blood sugar?  1-2 times/day    Fasting Blood glucose range (mg/dL)  [phone]   [phone]   Postprandial Blood glucose range (mg/dL)  [phone]   270J   Number of hypoglycemic episodes per month  0    Number of hyperglycemic episodes per week  0    Have you had a dilated eye exam in the past 12 months?  No    Have you had a dental exam in the past 12 months?  Yes  Are you checking your feet?  Yes    How many days per week are you checking your feet?  7      Dietary Intake   Breakfast  fruit incl grapefruit, small bread, guac, tomato, sometimes chicken OR egg, 2 slices [place] bacon, coffee OR sugarfree applesauce    Snack (morning)  none    Lunch  left overs OR soup OR fish, vegetable or fruit or salad, cheese    Snack (afternoon)  sugar-free cookie, pudding, jello OR mixed nuts or sunflower seeds    Dinner  sandwich, a few fries OR leafy greens, another veggie, fish or chicken OR beef noodle soup OR cheese, veg, chicken,noodles soup    Snack (evening)  cookie or 2 bites of a brownie OR sugar free ice cream bars OR     Beverage(s)  flavored water, diet soda occassionally      Exercise   Exercise Type  ADL's    How many days per week to you exercise?  0    How many minutes per day do you exercise?  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    Medications  Other (comment)   supplement cautions     Individualized Goals (developed by patient)   Nutrition  General guidelines for healthy choices and portions discussed    Physical Activity  Exercise 3-5 times per week      Outcomes   Expected Outcomes  Demonstrated interest in learning. Expect positive outcomes    Future DMSE  4-6 wks    Program Status  Not Completed       Individualized Plan for Diabetes Self-Management Training:   Learning Objective:  Patient will have a greater understanding of diabetes self-management. Patient education plan is to attend individual and/or group sessions per assessed needs and concerns.  Patient Instructions  [employer] is a testing company you may want to check out if you are thinking about trying dietary supplements Your [employer] membership may be covered, silver sneakers program Water aerobics is a great idea. If you work up to 3-5 times per week 30-45 min can make a difference in helping address insulin resistance. Aim to eat balanced meals and snacks   Expected Outcomes:  Demonstrated interest in learning. Expect positive outcomes  Education material provided: Meal plan card, Snack sheet and Carbohydrate counting sheet  If problems or questions, patient to contact team via:  Phone  Future DSME appointment: 4-6 wks

## 2018-12-14 DIAGNOSIS — E1165 Type 2 diabetes mellitus with hyperglycemia: Secondary | ICD-10-CM | POA: Insufficient documentation

## 2019-11-20 IMAGING — MR MR SHOULDER*L* W/O CM
5 series · 40 of 40 positions shown · non-contrast
Comparison: Left shoulder x-rays dated December 28, 2017.

CLINICAL DATA: Chronic left shoulder pain and decreased range of
motion for the past few months. No acute injury.

EXAM:
MRI OF THE LEFT SHOULDER WITHOUT CONTRAST
TECHNIQUE: Multiplanar, multisequence MR imaging of the shoulder was performed.
No intravenous contrast was administered.

[Series 10: T2 fat-sat · oblique · 4.0mm · 0.59mm/px · 8 of 18 slices shown (1 of 3)]
[im 1/18]
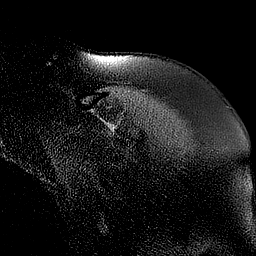
[im 3/18]
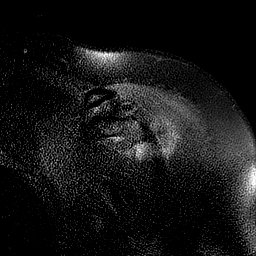
[im 5/18]
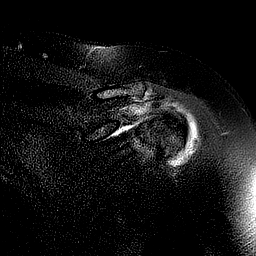
[im 8/18]
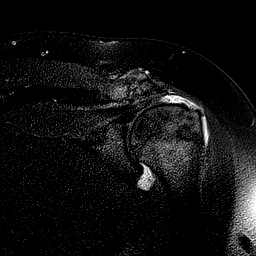
[im 10/18]
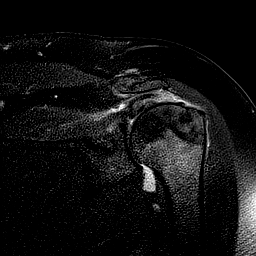
[im 13/18]
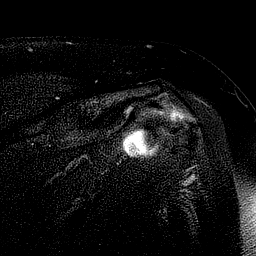
[im 15/18]
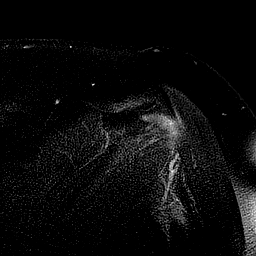
[im 18/18]
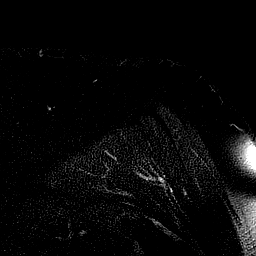

[Series 11: PD · oblique · 4.0mm · 0.61mm/px · 8 of 18 slices shown]
[im 1/18]
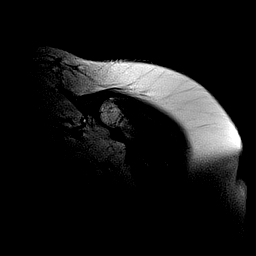
[im 3/18]
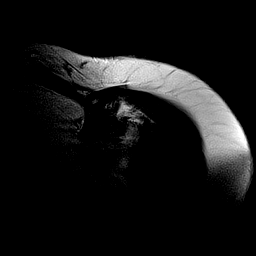
[im 5/18]
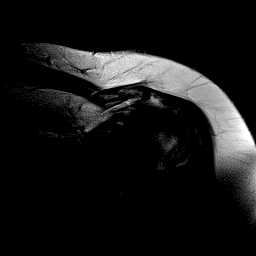
[im 8/18]
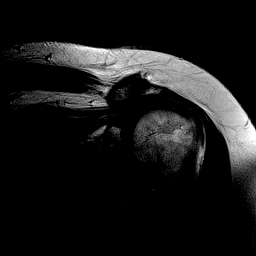
[im 10/18]
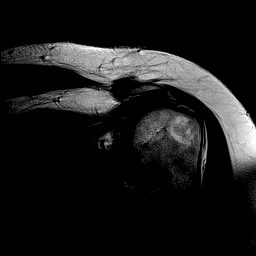
[im 13/18]
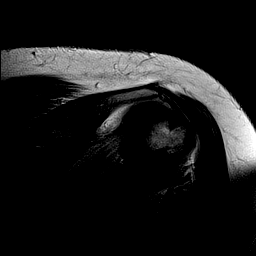
[im 15/18]
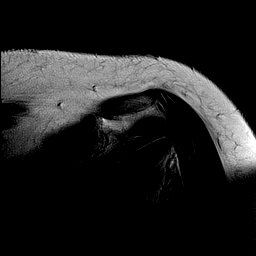
[im 18/18]
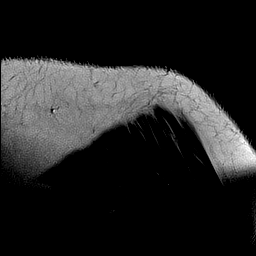

[Series 12: T2 fat-sat · sagittal · 4.0mm · 0.59mm/px · 8 of 20 slices shown (2 of 3)]
[im 1/20]
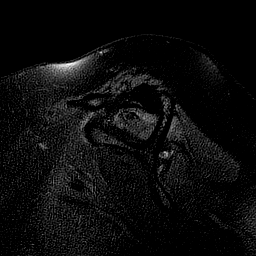
[im 3/20]
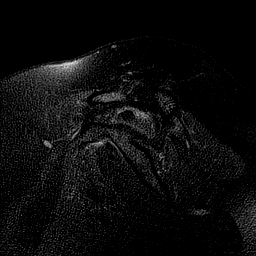
[im 6/20]
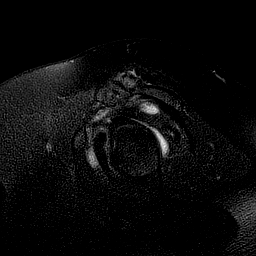
[im 9/20]
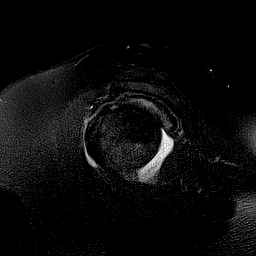
[im 11/20]
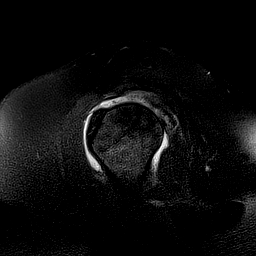
[im 14/20]
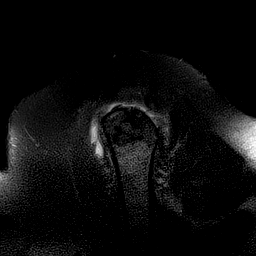
[im 17/20]
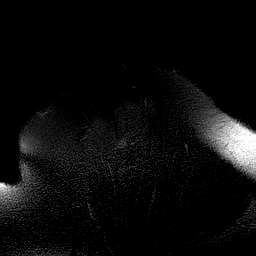
[im 20/20]
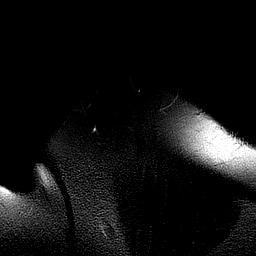

[Series 13: T1 · sagittal · 4.0mm · 0.59mm/px · 8 of 20 slices shown]
[im 1/20]
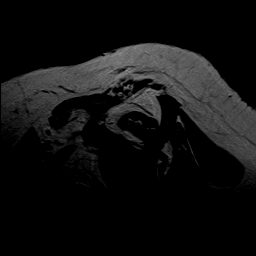
[im 3/20]
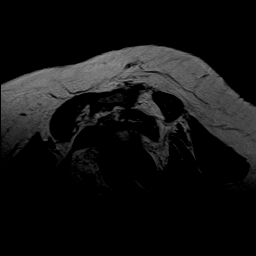
[im 6/20]
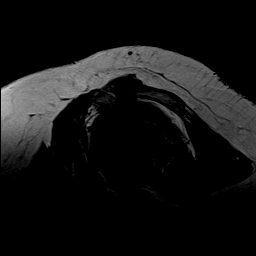
[im 9/20]
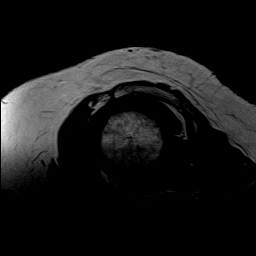
[im 11/20]
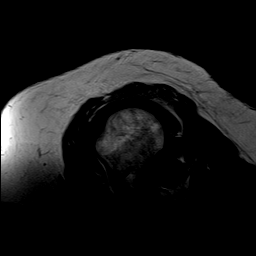
[im 14/20]
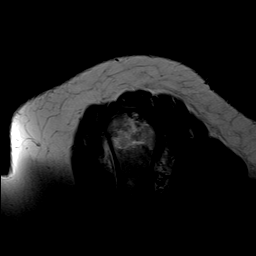
[im 17/20]
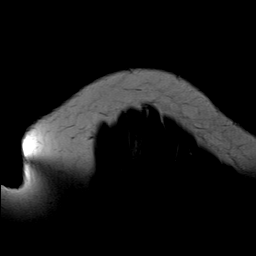
[im 20/20]
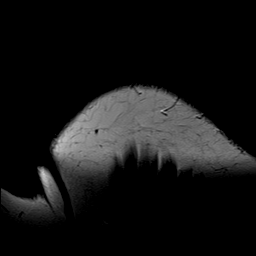

[Series 14: T2 fat-sat · axial · 4.0mm · 0.55mm/px · z∈[-79,+5]mm · 8 of 20 slices shown (3 of 3)]
[im 1/20]
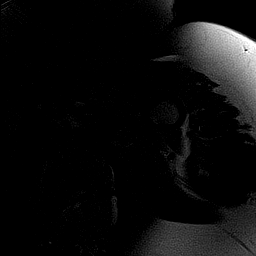
[im 3/20]
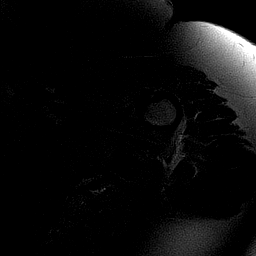
[im 6/20]
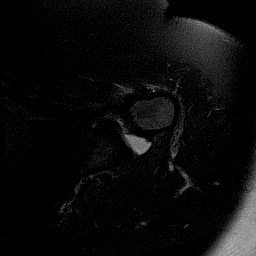
[im 9/20]
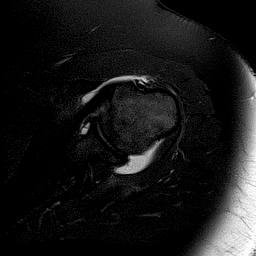
[im 11/20]
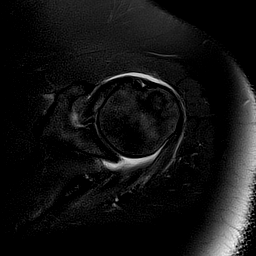
[im 14/20]
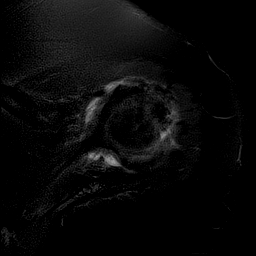
[im 17/20]
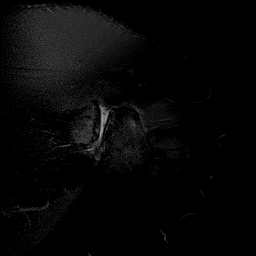
[im 20/20]
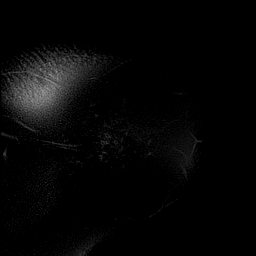

[40 of 40 positions shown; findings below may reference images not displayed]

FINDINGS: Rotator cuff: Full-thickness, full width tear of the supraspinatus
tendon with approximately 2 cm of retraction to the mid humeral
head. Full-thickness, near full width tear of the infraspinatus
tendon with approximately 1.7 cm of retraction to the mid humeral
head. The subscapularis and teres minor tendons are unremarkable.

Muscles: Mild fatty atrophy of the supraspinatus and infraspinatus
muscles. No muscle edema.

Biceps long head:  Intact and normally positioned.

Acromioclavicular Joint: Moderate arthropathy of the
acromioclavicular joint. Type III acromion. Small amount of
subacromial/subdeltoid bursal fluid.

Glenohumeral Joint: Small joint effusion.  No focal chondral defect.

Labrum:  Blunting of the superior and posterosuperior labrum.

Bones:  No fracture dislocation.

Other: None.
IMPRESSION: 1. Full-thickness, full width tear of the supraspinatus tendon with
2 cm retraction. Mild supraspinatus muscle atrophy.
2. Full-thickness, near full width tear of the infraspinatus tendon
with 1.7 cm retraction. Mild infraspinatus muscle atrophy.
3. Moderate acromioclavicular osteoarthritis.
4. Blunting of the superior and posterosuperior labrum may reflect
degenerative tearing.
5. Small glenohumeral joint effusion.

## 2023-11-15 ENCOUNTER — Ambulatory Visit: Payer: 59 | Attending: Orthopaedic Surgery

## 2023-11-15 ENCOUNTER — Other Ambulatory Visit: Payer: Self-pay

## 2023-11-15 DIAGNOSIS — M6281 Muscle weakness (generalized): Secondary | ICD-10-CM | POA: Diagnosis present

## 2023-11-15 DIAGNOSIS — M25561 Pain in right knee: Secondary | ICD-10-CM | POA: Diagnosis present

## 2023-11-15 DIAGNOSIS — R2689 Other abnormalities of gait and mobility: Secondary | ICD-10-CM | POA: Insufficient documentation

## 2023-11-15 DIAGNOSIS — M25661 Stiffness of right knee, not elsewhere classified: Secondary | ICD-10-CM | POA: Diagnosis present

## 2023-11-15 DIAGNOSIS — R600 Localized edema: Secondary | ICD-10-CM | POA: Insufficient documentation

## 2023-11-15 NOTE — Therapy (Signed)
OUTPATIENT PHYSICAL THERAPY LOWER EXTREMITY EVALUATION   Patient Name: Terri Nicholson MRN: [id number] DOB:August 03, 1956, 68 y.o., female Today's Date: 11/15/2023  END OF SESSION:  PT End of Session - 11/15/23 1449     Visit Number 1    Number of Visits 17    Date for PT Re-Evaluation 01/15/24    Authorization Type [employer]    PT Start Time 1449    PT Stop Time 1530    PT Time Calculation (min) 41 min    Equipment Utilized During Treatment Gait belt    Activity Tolerance Patient tolerated treatment well    Behavior During Therapy WFL for tasks assessed/performed             Past Medical History:  Diagnosis Date   Arthritis    Asthma    Diabetes mellitus without complication (HCC)    Hypertension    IFG (impaired fasting glucose)    Past Surgical History:  Procedure Laterality Date   CARDIAC SURGERY     CESAREAN SECTION     EYE SURGERY     HERNIA REPAIR     Patient Active Problem List   Diagnosis Date Noted   Uncontrolled type 2 diabetes mellitus with hyperglycemia (HCC) 12/14/2018   Complete rotator cuff tear 01/05/2018   Palpitations 03/04/2017   DIABETES MELLITUS, TYPE II 09/19/2007   HYPERTENSION 09/19/2007    PCP: Johny Blamer, MD  REFERRING PROVIDER: Marcene Corning, MD  REFERRING DIAG: s/p Rt total knee replacement   THERAPY DIAG:  Acute pain of right knee  Stiffness of right knee, not elsewhere classified  Localized edema  Muscle weakness (generalized)  Other abnormalities of gait and mobility  Rationale for Evaluation and Treatment: Rehabilitation  ONSET DATE: 11/11/23  SUBJECTIVE:   SUBJECTIVE STATEMENT: Patient underwent Rt TKA on 11/11/23. She had a fall on Saturday when trying to transfer off the toilet. She had X-rays taken after the fall and everything looked fine. Patient reports she is walking several times daily with her RW for short distances. She has been completing some of the exercises provided post-op, but they are "rough."  Patient reports difficulty with walking, stairs, transfers, and putting weight on the RLE. She currently needs assistance with sit to stand transfers and it was really difficult getting in/out of the car today.   PERTINENT HISTORY: Diabetes   PAIN:  Are you having pain? Yes: NPRS scale: 5/10 currently; at worst 9/10 Pain location: Rt knee Pain description: sharp Aggravating factors: walking, transfers  Relieving factors: ice, pain medication   PRECAUTIONS: Fall  RED FLAGS: None   WEIGHT BEARING RESTRICTIONS: Yes WBAT RLE  FALLS:  Has patient fallen in last 6 months? Yes. Number of falls 5; mostly from tripping, most recent fall post-op when she was getting off the toilet  LIVING ENVIRONMENT: Lives with: lives with their spouse Lives in: House/apartment Stairs: Yes: Internal: flight steps; on left going up and External: 3 steps; can reach both Has following equipment at home: Single point cane, Walker - 2 wheeled, and Wheelchair (manual)  OCCUPATION: retired   PLOF: Independent  PATIENT GOALS: "to be able to have ADLs that are normal. Be functionally independent."   NEXT MD VISIT: 11/22/23  OBJECTIVE:  Note: Objective measures were completed at Evaluation unless otherwise noted.  DIAGNOSTIC FINDINGS: no recent imaging on file   PATIENT SURVEYS:  KOOS JR: 36/100 interval; 20/28   COGNITION: Overall cognitive status: Within functional limits for tasks assessed     SENSATION: Not  tested  EDEMA:  Bandages donned, though moderate edema present about the Rt knee    POSTURE:  maintains RLE in extension in seated   PALPATION: Not assessed   LOWER EXTREMITY ROM:  Active ROM Right eval Left eval  Hip flexion    Hip extension    Hip abduction    Hip adduction    Hip internal rotation    Hip external rotation    Knee flexion 60   Knee extension Lacking 13   Ankle dorsiflexion    Ankle plantarflexion    Ankle inversion    Ankle eversion     (Blank rows =  not tested)  LOWER EXTREMITY MMT:  MMT Right eval Left eval  Hip flexion    Hip extension    Hip abduction    Hip adduction    Hip internal rotation    Hip external rotation    Knee flexion    Knee extension 3-   Ankle dorsiflexion    Ankle plantarflexion    Ankle inversion    Ankle eversion     (Blank rows = not tested)   FUNCTIONAL TESTS:  Sit <> stand CGA Sit <> Supine Min A    GAIT: Distance walked: 5 ft  Assistive device utilized: Walker - 2 wheeled Level of assistance: CGA Comments: step to pattern, heavy use of UE support                                                                                                                                [facility] Adult PT Treatment:                                                DATE: 11/15/23 Therapeutic Exercise: Demonstrated, performed, and issued initial HEP.   Self Care: Discussed potential of home health PT in the short term due to limited functional mobility and allowing for safe participation in PT.     PATIENT EDUCATION:  Education details: see treatment.  Person educated: Patient Education method: Explanation, Demonstration, Tactile cues, Verbal cues, and Handouts Education comprehension: verbalized understanding, returned demonstration, verbal cues required, tactile cues required, and needs further education  HOME EXERCISE PROGRAM: Access Code: [email or web address] URL: [email or web address] Date: 11/15/2023 Prepared by: Letitia Libra  Exercises - Supine Heel Slide  - 2-3 x daily - 7 x weekly - 1 sets - 10 reps - 5 sec  hold - Supine Quad Set  - 2-3 x daily - 7 x weekly - 1 sets - 10 reps - Supine Ankle Pumps  - 2-3 x daily - 7 x weekly - 3 sets - 10 reps - Side to Side Weight Shift with Counter Support  - 2-3 x daily - 7 x weekly - 2 sets - 10 reps - Seated Knee Flexion AAROM  -  2-3 x daily - 7 x weekly - 1 sets - 10 reps - 5 sec  hold - Seated Knee Extension AAROM  - 2-3 x daily - 7 x weekly - 1  sets - 10 reps - 5 sec  hold  ASSESSMENT:  CLINICAL IMPRESSION: Patient is a 68 y.o. female who was seen today for physical therapy evaluation and treatment for s/p Rt TKA on 11/11/23. She has very limited functional mobility currently requiring assistance with transfers and CGA for short duration ambulation. She demonstrates Rt knee ROM, Rt knee strength, gait and balance deficits. She will benefit from skilled PT to address the above stated deficits in order to return to optimal function, though we did discuss that she reach out to referring provider as she might be appropriate for home health PT currently given her limited mobility and ability to attend outpatient PT.   OBJECTIVE IMPAIRMENTS: Abnormal gait, decreased activity tolerance, decreased balance, decreased endurance, decreased knowledge of condition, decreased mobility, difficulty walking, decreased ROM, decreased strength, increased edema, improper body mechanics, and pain.   ACTIVITY LIMITATIONS: carrying, lifting, bending, standing, squatting, sleeping, stairs, transfers, bed mobility, bathing, dressing, hygiene/grooming, and locomotion level  PARTICIPATION LIMITATIONS: meal prep, cleaning, laundry, driving, shopping, community activity, and yard work  PERSONAL FACTORS: Age, Fitness, and 3+ comorbidities: see PMH above   are also affecting patient's functional outcome.   REHAB POTENTIAL: Good  CLINICAL DECISION MAKING: Unstable/unpredictable  EVALUATION COMPLEXITY: Moderate   GOALS: Goals reviewed with patient? Yes  SHORT TERM GOALS: Target date: 12/13/2023   Patient will be independent and compliant with initial HEP.   Baseline:  issued at eval  Goal status: INITIAL  2.  Patient will demonstrate at least 90 degrees of Rt knee flexion AROM to improve ability to complete sit to stand transfers.  Baseline: see above Goal status: INITIAL  3.  Patient will improve Rt knee extension AROM by 10 degrees to improve gait  mechanics.  Baseline: see above  Goal status: INITIAL  4.  Patient will be independent with sit <> supine transfers.  Baseline: Min A  Goal status: INITIAL    LONG TERM GOALS: Target date: 01/15/24  Patient will score >/= 55/100 interval score on the KOOS JR (MCID is 14) to signify clinically meaningful improvement in functional abilities.   Baseline: see above  Goal status: INITIAL  2.  Patient will be able to ascend/descend stairs Mod I with handrail use.  Baseline: requires assistance  Goal status: INITIAL  3.  Patient will be able to ambulate community distances with LRAD.  Baseline: short household distances with RW  Goal status: INITIAL  4.  Patient will demonstrate at least 110 degrees of Rt knee flexion AROM to improve ability to complete sit to stand transfers.  Baseline: see above Goal status: INITIAL  5.  Patient will report pain at worst rated as </= 3/10 to reduce current functional limitations.  Baseline: 9 Goal status: INITIAL   PLAN:  PT FREQUENCY: 2x/week  PT DURATION: 8 weeks  PLANNED INTERVENTIONS: 97164- PT Re-evaluation, 97110-Therapeutic exercises, 97530- Therapeutic activity, [id number]- Neuromuscular re-education, 97535- Self Care, 16109- Manual therapy, [id number]- Gait training, [id number]- Aquatic Therapy, 97014- Electrical stimulation (unattended), [id number]- Electrical stimulation (manual), 97016- Vasopneumatic device, Dry Needling, Cryotherapy, and Moist heat  PLAN FOR NEXT SESSION: review and progress HEP prn. Rt knee ROM and strengthening; gait training.   Letitia Libra, PT, DPT, ATC 11/15/23 4:33 PM

## 2023-11-18 ENCOUNTER — Ambulatory Visit: Payer: 59

## 2023-11-23 ENCOUNTER — Ambulatory Visit: Payer: 59

## 2023-11-23 ENCOUNTER — Encounter: Payer: 59 | Admitting: Physical Therapy

## 2023-11-29 NOTE — Therapy (Signed)
OUTPATIENT PHYSICAL THERAPY TREATMENT   Patient Name: Terri Nicholson MRN: [id number] DOB:1955/10/27, 68 y.o., female Today's Date: 11/30/2023  END OF SESSION:  PT End of Session - 11/30/23 0808     Visit Number 2    Number of Visits 17    Date for PT Re-Evaluation 01/15/24    Authorization Type [employer]    PT Start Time 0809   late check in   PT Stop Time 0845    PT Time Calculation (min) 36 min    Activity Tolerance Patient tolerated treatment well              Past Medical History:  Diagnosis Date   Arthritis    Asthma    Diabetes mellitus without complication (HCC)    Hypertension    IFG (impaired fasting glucose)    Past Surgical History:  Procedure Laterality Date   CARDIAC SURGERY     CESAREAN SECTION     EYE SURGERY     HERNIA REPAIR     Patient Active Problem List   Diagnosis Date Noted   Uncontrolled type 2 diabetes mellitus with hyperglycemia (HCC) 12/14/2018   Complete rotator cuff tear 01/05/2018   Palpitations 03/04/2017   DIABETES MELLITUS, TYPE II 09/19/2007   HYPERTENSION 09/19/2007    PCP: Johny Blamer, MD  REFERRING PROVIDER: Marcene Corning, MD  REFERRING DIAG: s/p Rt total knee replacement   THERAPY DIAG:  Acute pain of right knee  Stiffness of right knee, not elsewhere classified  Localized edema  Muscle weakness (generalized)  Other abnormalities of gait and mobility  Rationale for Evaluation and Treatment: Rehabilitation  ONSET DATE: 11/11/23  SUBJECTIVE:   Per eval - Patient underwent Rt TKA on 11/11/23. She had a fall on Saturday when trying to transfer off the toilet. She had X-rays taken after the fall and everything looked fine. Patient reports she is walking several times daily with her RW for short distances. She has been completing some of the exercises provided post-op, but they are "rough." Patient reports difficulty with walking, stairs, transfers, and putting weight on the RLE. She currently needs assistance  with sit to stand transfers and it was really difficult getting in/out of the car today.   SUBJECTIVE STATEMENT: 11/30/2023 Pt states she has been doing better, doing exercises 2-3 times a day, has been trying to walk around the house. Wasn't able to get HHPT due to insurance. States she saw surgeon last week and they were pleased with progress. No other new updates  PERTINENT HISTORY: Diabetes   PAIN:  Are you having pain? Yes: NPRS scale: 4/10 currently; at worst 9/10 Pain location: Rt knee Pain description: sharp Aggravating factors: walking, transfers  Relieving factors: ice, pain medication   PRECAUTIONS: Fall  RED FLAGS: None   WEIGHT BEARING RESTRICTIONS: Yes WBAT RLE  FALLS:  Has patient fallen in last 6 months? Yes. Number of falls 5; mostly from tripping, most recent fall post-op when she was getting off the toilet  LIVING ENVIRONMENT: Lives with: lives with their spouse Lives in: House/apartment Stairs: Yes: Internal: flight steps; on left going up and External: 3 steps; can reach both Has following equipment at home: Single point cane, Walker - 2 wheeled, and Wheelchair (manual)  OCCUPATION: retired   PLOF: Independent  PATIENT GOALS: "to be able to have ADLs that are normal. Be functionally independent."   NEXT MD VISIT: beginning of March  OBJECTIVE:  Note: Objective measures were completed at Evaluation unless otherwise noted.  DIAGNOSTIC FINDINGS: no recent imaging on file   PATIENT SURVEYS:  KOOS JR: 36/100 interval; 20/28   COGNITION: Overall cognitive status: Within functional limits for tasks assessed     SENSATION: Not tested  EDEMA:  Bandages donned, though moderate edema present about the Rt knee    POSTURE:  maintains RLE in extension in seated   PALPATION: Not assessed   LOWER EXTREMITY ROM:  Active ROM Right eval Left eval Right 11/30/23  Hip flexion     Hip extension     Hip abduction     Hip adduction     Hip internal  rotation     Hip external rotation     Knee flexion 60  A: 89 deg seated  Knee extension Lacking 13    Ankle dorsiflexion     Ankle plantarflexion     Ankle inversion     Ankle eversion      (Blank rows = not tested)  LOWER EXTREMITY MMT:  MMT Right eval Left eval  Hip flexion    Hip extension    Hip abduction    Hip adduction    Hip internal rotation    Hip external rotation    Knee flexion    Knee extension 3-   Ankle dorsiflexion    Ankle plantarflexion    Ankle inversion    Ankle eversion     (Blank rows = not tested)   FUNCTIONAL TESTS:  Sit <> stand CGA Sit <> Supine Min A    GAIT: Distance walked: 5 ft  Assistive device utilized: Walker - 2 wheeled Level of assistance: CGA Comments: step to pattern, heavy use of UE support    [facility] Adult PT Treatment:                                                DATE: 11/30/23 Therapeutic Exercise: RLE heel slides x10 w/ slider and strap  Supine quad set 2x10 (second set with quad) HEP handout + education  Therapeutic Activity: Medial/lateral weight shift w/ RW x12 Modified tandem AP weight shift w/ RW x10 STS transfers training x3 w RW cues for sequencing and mechanics                                                                                                                                [facility] Adult PT Treatment:                                                DATE: 11/15/23 Therapeutic Exercise: Demonstrated, performed, and issued initial HEP.   Self Care: Discussed potential of home health PT in the short term due to limited functional  mobility and allowing for safe participation in PT.     PATIENT EDUCATION:  Education details: rationale for interventions, HEP  Person educated: Patient Education method: Explanation, Demonstration, Tactile cues, Verbal cues Education comprehension: verbalized understanding, returned demonstration, verbal cues required, tactile cues required, and needs further education      HOME EXERCISE PROGRAM: Access Code: [email or web address] URL: [email or web address] Date: 11/30/2023 Prepared by: Fransisco Hertz  Exercises - Supine Heel Slide  - 2-3 x daily - 7 x weekly - 1 sets - 10 reps - 5 sec  hold - Supine Ankle Pumps  - 2-3 x daily - 7 x weekly - 3 sets - 10 reps - Side to Side Weight Shift with Counter Support  - 2-3 x daily - 7 x weekly - 2 sets - 10 reps - Seated Knee Flexion AAROM  - 2-3 x daily - 7 x weekly - 1 sets - 10 reps - 5 sec  hold - Seated Knee Extension AAROM  - 2-3 x daily - 7 x weekly - 1 sets - 10 reps - 5 sec  hold - Long Sitting Quad Set with Towel Roll Under Heel  - 2-3 x daily - 7 x weekly - 1 sets - 10 reps  ASSESSMENT:  CLINICAL IMPRESSION: 11/30/2023 Pt arrives w/ report of improvement since initial eval, good adherence with HEP. Does demonstrate good improvement in flexion ROM compared to eval, ambulating within clinic using RW. Noted improvement in gait mechanics as session goes on although she does endorse some muscular fatigue. Pt politely declines vaso, encouraged icing at home. No adverse events or increase in pain on departure. Recommend continuing along current POC in order to address relevant deficits and improve functional tolerance. Pt departs today's session in no acute distress, all voiced questions/concerns addressed appropriately from PT perspective.    Per eval - Patient is a 68 y.o. female who was seen today for physical therapy evaluation and treatment for s/p Rt TKA on 11/11/23. She has very limited functional mobility currently requiring assistance with transfers and CGA for short duration ambulation. She demonstrates Rt knee ROM, Rt knee strength, gait and balance deficits. She will benefit from skilled PT to address the above stated deficits in order to return to optimal function, though we did discuss that she reach out to referring provider as she might be appropriate for home health PT currently given her limited  mobility and ability to attend outpatient PT.   OBJECTIVE IMPAIRMENTS: Abnormal gait, decreased activity tolerance, decreased balance, decreased endurance, decreased knowledge of condition, decreased mobility, difficulty walking, decreased ROM, decreased strength, increased edema, improper body mechanics, and pain.   ACTIVITY LIMITATIONS: carrying, lifting, bending, standing, squatting, sleeping, stairs, transfers, bed mobility, bathing, dressing, hygiene/grooming, and locomotion level  PARTICIPATION LIMITATIONS: meal prep, cleaning, laundry, driving, shopping, community activity, and yard work  PERSONAL FACTORS: Age, Fitness, and 3+ comorbidities: see PMH above   are also affecting patient's functional outcome.   REHAB POTENTIAL: Good  CLINICAL DECISION MAKING: Unstable/unpredictable  EVALUATION COMPLEXITY: Moderate   GOALS: Goals reviewed with patient? Yes  SHORT TERM GOALS: Target date: 12/13/2023   Patient will be independent and compliant with initial HEP.   Baseline:  issued at eval  Goal status: INITIAL  2.  Patient will demonstrate at least 90 degrees of Rt knee flexion AROM to improve ability to complete sit to stand transfers.  Baseline: see above Goal status: INITIAL  3.  Patient will improve Rt knee extension AROM by 10 degrees to improve  gait mechanics.  Baseline: see above  Goal status: INITIAL  4.  Patient will be independent with sit <> supine transfers.  Baseline: Min A  Goal status: INITIAL    LONG TERM GOALS: Target date: 01/15/24  Patient will score >/= 55/100 interval score on the KOOS JR (MCID is 14) to signify clinically meaningful improvement in functional abilities.   Baseline: see above  Goal status: INITIAL  2.  Patient will be able to ascend/descend stairs Mod I with handrail use.  Baseline: requires assistance  Goal status: INITIAL  3.  Patient will be able to ambulate community distances with LRAD.  Baseline: short household distances  with RW  Goal status: INITIAL  4.  Patient will demonstrate at least 110 degrees of Rt knee flexion AROM to improve ability to complete sit to stand transfers.  Baseline: see above Goal status: INITIAL  5.  Patient will report pain at worst rated as </= 3/10 to reduce current functional limitations.  Baseline: 9 Goal status: INITIAL   PLAN:  PT FREQUENCY: 2x/week  PT DURATION: 8 weeks  PLANNED INTERVENTIONS: 97164- PT Re-evaluation, 97110-Therapeutic exercises, 97530- Therapeutic activity, [id number]- Neuromuscular re-education, 97535- Self Care, 16109- Manual therapy, [id number]- Gait training, [id number]- Aquatic Therapy, 97014- Electrical stimulation (unattended), [id number]- Electrical stimulation (manual), 97016- Vasopneumatic device, Dry Needling, Cryotherapy, and Moist heat  PLAN FOR NEXT SESSION: review and progress HEP prn. Rt knee ROM and strengthening; gait training.   Ashley Murrain PT, DPT 11/30/2023 11:52 AM

## 2023-11-30 ENCOUNTER — Ambulatory Visit: Payer: 59 | Admitting: Physical Therapy

## 2023-11-30 ENCOUNTER — Encounter: Payer: Self-pay | Admitting: Physical Therapy

## 2023-11-30 DIAGNOSIS — M25561 Pain in right knee: Secondary | ICD-10-CM

## 2023-11-30 DIAGNOSIS — M25661 Stiffness of right knee, not elsewhere classified: Secondary | ICD-10-CM

## 2023-11-30 DIAGNOSIS — M6281 Muscle weakness (generalized): Secondary | ICD-10-CM

## 2023-11-30 DIAGNOSIS — R2689 Other abnormalities of gait and mobility: Secondary | ICD-10-CM

## 2023-11-30 DIAGNOSIS — R600 Localized edema: Secondary | ICD-10-CM

## 2023-12-07 ENCOUNTER — Ambulatory Visit: Payer: 59 | Admitting: Physical Therapy

## 2023-12-14 ENCOUNTER — Ambulatory Visit: Payer: 59 | Attending: Orthopaedic Surgery

## 2023-12-14 DIAGNOSIS — M6281 Muscle weakness (generalized): Secondary | ICD-10-CM | POA: Diagnosis present

## 2023-12-14 DIAGNOSIS — M25561 Pain in right knee: Secondary | ICD-10-CM | POA: Insufficient documentation

## 2023-12-14 DIAGNOSIS — R2689 Other abnormalities of gait and mobility: Secondary | ICD-10-CM | POA: Insufficient documentation

## 2023-12-14 DIAGNOSIS — R600 Localized edema: Secondary | ICD-10-CM | POA: Insufficient documentation

## 2023-12-14 DIAGNOSIS — M25661 Stiffness of right knee, not elsewhere classified: Secondary | ICD-10-CM | POA: Diagnosis present

## 2023-12-14 NOTE — Therapy (Signed)
 OUTPATIENT PHYSICAL THERAPY TREATMENT   Patient Name: Terri Nicholson MRN: [id number] DOB:12-10-1955, 68 y.o., female Today's Date: 12/14/2023  END OF SESSION:  PT End of Session - 12/14/23 0800     Visit Number 3    Number of Visits 17    Date for PT Re-Evaluation 01/15/24    Authorization Type [employer]    PT Start Time 0800    PT Stop Time 0841    PT Time Calculation (min) 41 min    Activity Tolerance Patient tolerated treatment well               Past Medical History:  Diagnosis Date   Arthritis    Asthma    Diabetes mellitus without complication (HCC)    Hypertension    IFG (impaired fasting glucose)    Past Surgical History:  Procedure Laterality Date   CARDIAC SURGERY     CESAREAN SECTION     EYE SURGERY     HERNIA REPAIR     Patient Active Problem List   Diagnosis Date Noted   Uncontrolled type 2 diabetes mellitus with hyperglycemia (HCC) 12/14/2018   Complete rotator cuff tear 01/05/2018   Palpitations 03/04/2017   DIABETES MELLITUS, TYPE II 09/19/2007   HYPERTENSION 09/19/2007    PCP: Johny Blamer, MD  REFERRING PROVIDER: Marcene Corning, MD  REFERRING DIAG: s/p Rt total knee replacement   THERAPY DIAG:  Acute pain of right knee  Stiffness of right knee, not elsewhere classified  Localized edema  Muscle weakness (generalized)  Other abnormalities of gait and mobility  Rationale for Evaluation and Treatment: Rehabilitation  ONSET DATE: 11/11/23  SUBJECTIVE:   Per eval - Patient underwent Rt TKA on 11/11/23. She had a fall on Saturday when trying to transfer off the toilet. She had X-rays taken after the fall and everything looked fine. Patient reports she is walking several times daily with her RW for short distances. She has been completing some of the exercises provided post-op, but they are "rough." Patient reports difficulty with walking, stairs, transfers, and putting weight on the RLE. She currently needs assistance with sit to  stand transfers and it was really difficult getting in/out of the car today.   SUBJECTIVE STATEMENT: Patient reports the knee has been sore. She continues to ice, which feels good. Has been ambulating with her RW.   PERTINENT HISTORY: Diabetes   PAIN:  Are you having pain? Yes: NPRS scale: 3/10 Pain location: Rt knee Pain description: sore Aggravating factors: walking, transfers  Relieving factors: ice, pain medication   PRECAUTIONS: Fall  RED FLAGS: None   WEIGHT BEARING RESTRICTIONS: Yes WBAT RLE  FALLS:  Has patient fallen in last 6 months? Yes. Number of falls 5; mostly from tripping, most recent fall post-op when she was getting off the toilet  LIVING ENVIRONMENT: Lives with: lives with their spouse Lives in: House/apartment Stairs: Yes: Internal: flight steps; on left going up and External: 3 steps; can reach both Has following equipment at home: Single point cane, Walker - 2 wheeled, and Wheelchair (manual)  OCCUPATION: retired   PLOF: Independent  PATIENT GOALS: "to be able to have ADLs that are normal. Be functionally independent."   NEXT MD VISIT: 01/27/24  OBJECTIVE:  Note: Objective measures were completed at Evaluation unless otherwise noted.  DIAGNOSTIC FINDINGS: no recent imaging on file   PATIENT SURVEYS:  KOOS JR: 36/100 interval; 20/28   COGNITION: Overall cognitive status: Within functional limits for tasks assessed  SENSATION: Not tested  EDEMA:  Bandages donned, though moderate edema present about the Rt knee    POSTURE:  maintains RLE in extension in seated   PALPATION: Not assessed   LOWER EXTREMITY ROM:  Active ROM Right eval Left eval Right 11/30/23  Hip flexion     Hip extension     Hip abduction     Hip adduction     Hip internal rotation     Hip external rotation     Knee flexion 60  A: 89 deg seated  Knee extension Lacking 13    Ankle dorsiflexion     Ankle plantarflexion     Ankle inversion     Ankle eversion       (Blank rows = not tested)  LOWER EXTREMITY MMT:  MMT Right eval Left eval  Hip flexion    Hip extension    Hip abduction    Hip adduction    Hip internal rotation    Hip external rotation    Knee flexion    Knee extension 3-   Ankle dorsiflexion    Ankle plantarflexion    Ankle inversion    Ankle eversion     (Blank rows = not tested)   FUNCTIONAL TESTS:  Sit <> stand CGA Sit <> Supine Min A    GAIT: Distance walked: 5 ft  Assistive device utilized: Environmental consultant - 2 wheeled Level of assistance: CGA Comments: step to pattern, heavy use of UE support   [facility] Adult PT Treatment:                                                DATE: 12/14/23 Therapeutic Exercise: NuStep level 4 x 5 minutes UE/LE  Supine heel slides with strap x 10; 5 sec hold  Calf stretch with strap 2 x 30 sec   Gait training: With RW focusing on heel strike and allowing for knee flexion during swing and widening stance- 3 laps in clinic with rest break in between  Neuromuscular re-education: Quad sets with heavy cues for quad activation 2 x 10; 5 sec hold  LAQ 2 x 10    [facility] Adult PT Treatment:                                                DATE: 11/30/23 Therapeutic Exercise: RLE heel slides x10 w/ slider and strap  Supine quad set 2x10 (second set with quad) HEP handout + education  Therapeutic Activity: Medial/lateral weight shift w/ RW x12 Modified tandem AP weight shift w/ RW x10 STS transfers training x3 w RW cues for sequencing and mechanics  [facility] Adult PT Treatment:                                                DATE: 11/15/23 Therapeutic Exercise: Demonstrated, performed, and issued initial HEP.   Self Care: Discussed potential of home health PT in the short term due to limited functional mobility and allowing for safe participation in PT.     PATIENT  EDUCATION:  Education details: HEP review  Person educated: Patient Education method: Explanation, Demonstration, Actor cues, Verbal cues Education comprehension: verbalized understanding, returned demonstration, verbal cues required, tactile cues required, and needs further education     HOME EXERCISE PROGRAM: Access Code: [email or web address] URL: [email or web address] Date: 11/30/2023 Prepared by: Fransisco Hertz  Exercises - Supine Heel Slide  - 2-3 x daily - 7 x weekly - 1 sets - 10 reps - 5 sec  hold - Supine Ankle Pumps  - 2-3 x daily - 7 x weekly - 3 sets - 10 reps - Side to Side Weight Shift with Counter Support  - 2-3 x daily - 7 x weekly - 2 sets - 10 reps - Seated Knee Flexion AAROM  - 2-3 x daily - 7 x weekly - 1 sets - 10 reps - 5 sec  hold - Seated Knee Extension AAROM  - 2-3 x daily - 7 x weekly - 1 sets - 10 reps - 5 sec  hold - Long Sitting Quad Set with Towel Roll Under Heel  - 2-3 x daily - 7 x weekly - 1 sets - 10 reps  ASSESSMENT:  CLINICAL IMPRESSION: Worked on gait training today with RW as patient noted to walk in clinic maintaining knee in extension with step to pattern. With cues she is able to complete step through pattern and allow for knee flexion during swing utilizing RW. Initial difficulty with quad set having difficulty engaging quad, but with tactile and verbal cues is able to activate with continued reps. She reported muscular fatigue at conclusion of session, but no increase in pain.   Per eval - Patient is a 68 y.o. female who was seen today for physical therapy evaluation and treatment for s/p Rt TKA on 11/11/23. She has very limited functional mobility currently requiring assistance with transfers and CGA for short duration ambulation. She demonstrates Rt knee ROM, Rt knee strength, gait and balance deficits. She will benefit from skilled PT to address the above stated deficits in order to return to optimal function, though we did discuss that she  reach out to referring provider as she might be appropriate for home health PT currently given her limited mobility and ability to attend outpatient PT.   OBJECTIVE IMPAIRMENTS: Abnormal gait, decreased activity tolerance, decreased balance, decreased endurance, decreased knowledge of condition, decreased mobility, difficulty walking, decreased ROM, decreased strength, increased edema, improper body mechanics, and pain.   ACTIVITY LIMITATIONS: carrying, lifting, bending, standing, squatting, sleeping, stairs, transfers, bed mobility, bathing, dressing, hygiene/grooming, and locomotion level  PARTICIPATION LIMITATIONS: meal prep, cleaning, laundry, driving, shopping, community activity, and yard work  PERSONAL FACTORS: Age, Fitness, and 3+ comorbidities: see PMH above   are also affecting patient's functional outcome.   REHAB POTENTIAL: Good  CLINICAL DECISION MAKING: Unstable/unpredictable  EVALUATION COMPLEXITY: Moderate   GOALS: Goals reviewed with patient? Yes  SHORT TERM GOALS: Target date: 12/13/2023   Patient will be independent and compliant with  initial HEP.   Baseline:  issued at eval  Goal status: ongoing   2.  Patient will demonstrate at least 90 degrees of Rt knee flexion AROM to improve ability to complete sit to stand transfers.  Baseline: see above Goal status: progressing   3.  Patient will improve Rt knee extension AROM by 10 degrees to improve gait mechanics.  Baseline: see above  Goal status: INITIAL  4.  Patient will be independent with sit <> supine transfers.  Baseline: Min A  12/14/23: independent with sit to supine transfer  Goal status: MET    LONG TERM GOALS: Target date: 01/15/24  Patient will score >/= 55/100 interval score on the KOOS JR (MCID is 14) to signify clinically meaningful improvement in functional abilities.   Baseline: see above  Goal status: INITIAL  2.  Patient will be able to ascend/descend stairs Mod I with handrail use.   Baseline: requires assistance  Goal status: INITIAL  3.  Patient will be able to ambulate community distances with LRAD.  Baseline: short household distances with RW  Goal status: INITIAL  4.  Patient will demonstrate at least 110 degrees of Rt knee flexion AROM to improve ability to complete sit to stand transfers.  Baseline: see above Goal status: INITIAL  5.  Patient will report pain at worst rated as </= 3/10 to reduce current functional limitations.  Baseline: 9 Goal status: INITIAL   PLAN:  PT FREQUENCY: 2x/week  PT DURATION: 8 weeks  PLANNED INTERVENTIONS: 97164- PT Re-evaluation, 97110-Therapeutic exercises, 97530- Therapeutic activity, [id number]- Neuromuscular re-education, 97535- Self Care, [id number]- Manual therapy, [id number]- Gait training, [id number]- Aquatic Therapy, 97014- Electrical stimulation (unattended), [id number]- Electrical stimulation (manual), 97016- Vasopneumatic device, Dry Needling, Cryotherapy, and Moist heat  PLAN FOR NEXT SESSION: review and progress HEP prn. Rt knee ROM and strengthening; gait training. Check STG.   Letitia Libra, PT, DPT, ATC 12/14/23 8:43 AM

## 2023-12-21 ENCOUNTER — Ambulatory Visit

## 2023-12-21 DIAGNOSIS — R600 Localized edema: Secondary | ICD-10-CM

## 2023-12-21 DIAGNOSIS — M25561 Pain in right knee: Secondary | ICD-10-CM | POA: Diagnosis not present

## 2023-12-21 DIAGNOSIS — R2689 Other abnormalities of gait and mobility: Secondary | ICD-10-CM

## 2023-12-21 DIAGNOSIS — M25661 Stiffness of right knee, not elsewhere classified: Secondary | ICD-10-CM

## 2023-12-21 DIAGNOSIS — M6281 Muscle weakness (generalized): Secondary | ICD-10-CM

## 2023-12-21 NOTE — Therapy (Signed)
 OUTPATIENT PHYSICAL THERAPY TREATMENT   Patient Name: Terri Nicholson MRN: [id number] DOB:April 18, 1956, 68 y.o., female Today's Date: 12/21/2023  END OF SESSION:  PT End of Session - 12/21/23 0848     Visit Number 4    Number of Visits 17    Date for PT Re-Evaluation 01/15/24    Authorization Type [employer]    PT Start Time 0847    PT Stop Time 0928    PT Time Calculation (min) 41 min    Activity Tolerance Patient tolerated treatment well                Past Medical History:  Diagnosis Date   Arthritis    Asthma    Diabetes mellitus without complication (HCC)    Hypertension    IFG (impaired fasting glucose)    Past Surgical History:  Procedure Laterality Date   CARDIAC SURGERY     CESAREAN SECTION     EYE SURGERY     HERNIA REPAIR     Patient Active Problem List   Diagnosis Date Noted   Uncontrolled type 2 diabetes mellitus with hyperglycemia (HCC) 12/14/2018   Complete rotator cuff tear 01/05/2018   Palpitations 03/04/2017   DIABETES MELLITUS, TYPE II 09/19/2007   HYPERTENSION 09/19/2007    PCP: Johny Blamer, MD  REFERRING PROVIDER: Marcene Corning, MD  REFERRING DIAG: s/p Rt total knee replacement   THERAPY DIAG:  Acute pain of right knee  Stiffness of right knee, not elsewhere classified  Localized edema  Muscle weakness (generalized)  Other abnormalities of gait and mobility  Rationale for Evaluation and Treatment: Rehabilitation  ONSET DATE: 11/11/23  SUBJECTIVE:   Per eval - Patient underwent Rt TKA on 11/11/23. She had a fall on Saturday when trying to transfer off the toilet. She had X-rays taken after the fall and everything looked fine. Patient reports she is walking several times daily with her RW for short distances. She has been completing some of the exercises provided post-op, but they are "rough." Patient reports difficulty with walking, stairs, transfers, and putting weight on the RLE. She currently needs assistance with sit to  stand transfers and it was really difficult getting in/out of the car today.   SUBJECTIVE STATEMENT: Patient reports sometimes the knee feels good and sometimes it hurts more.   PERTINENT HISTORY: Diabetes   PAIN:  Are you having pain? Yes: NPRS scale: 5/10 Pain location: Rt knee Pain description: sore Aggravating factors: walking, transfers  Relieving factors: ice, pain medication   PRECAUTIONS: Fall  RED FLAGS: None   WEIGHT BEARING RESTRICTIONS: Yes WBAT RLE  FALLS:  Has patient fallen in last 6 months? Yes. Number of falls 5; mostly from tripping, most recent fall post-op when she was getting off the toilet  LIVING ENVIRONMENT: Lives with: lives with their spouse Lives in: House/apartment Stairs: Yes: Internal: flight steps; on left going up and External: 3 steps; can reach both Has following equipment at home: Single point cane, Walker - 2 wheeled, and Wheelchair (manual)  OCCUPATION: retired   PLOF: Independent  PATIENT GOALS: "to be able to have ADLs that are normal. Be functionally independent."   NEXT MD VISIT: 01/27/24  OBJECTIVE:  Note: Objective measures were completed at Evaluation unless otherwise noted.  DIAGNOSTIC FINDINGS: no recent imaging on file   PATIENT SURVEYS:  KOOS JR: 36/100 interval; 20/28   COGNITION: Overall cognitive status: Within functional limits for tasks assessed     SENSATION: Not tested  EDEMA:  Bandages  donned, though moderate edema present about the Rt knee    POSTURE:  maintains RLE in extension in seated   PALPATION: Not assessed   LOWER EXTREMITY ROM:  Active ROM Right eval Left eval Right 11/30/23 12/21/23 Right   Hip flexion      Hip extension      Hip abduction      Hip adduction      Hip internal rotation      Hip external rotation      Knee flexion 60  A: 89 deg seated 86  Knee extension Lacking 13   Lacking 1   Ankle dorsiflexion      Ankle plantarflexion      Ankle inversion      Ankle  eversion       (Blank rows = not tested)  LOWER EXTREMITY MMT:  MMT Right eval Left eval  Hip flexion    Hip extension    Hip abduction    Hip adduction    Hip internal rotation    Hip external rotation    Knee flexion    Knee extension 3-   Ankle dorsiflexion    Ankle plantarflexion    Ankle inversion    Ankle eversion     (Blank rows = not tested)   FUNCTIONAL TESTS:  Sit <> stand CGA Sit <> Supine Min A    GAIT: Distance walked: 5 ft  Assistive device utilized: Environmental consultant - 2 wheeled Level of assistance: CGA Comments: step to pattern, heavy use of UE support  [facility] Adult PT Treatment:                                                DATE: 12/21/23 Therapeutic Exercise: NuStep level 4 x 5 minutes UE/LE  Hooklying hip abduction 2 x 10 green band 2 x 10  Updated HEP   Neuromuscular re-ed: Quad set x 10; 5 sec hold  SAQ 2 x 10  LAQ 2 x 10  Gait training: With RW focusing on heel strike and increasing step length- 2 laps in clinic  With quad cane step through pattern CGA- 1 lap in clinic    [facility] Adult PT Treatment:                                                DATE: 12/14/23 Therapeutic Exercise: NuStep level 4 x 5 minutes UE/LE  Supine heel slides with strap x 10; 5 sec hold  Calf stretch with strap 2 x 30 sec   Gait training: With RW focusing on heel strike and allowing for knee flexion during swing and widening stance- 3 laps in clinic with rest break in between  Neuromuscular re-education: Quad sets with heavy cues for quad activation 2 x 10; 5 sec hold  LAQ 2 x 10    [facility] Adult PT Treatment:                                                DATE: 11/30/23 Therapeutic Exercise: RLE heel slides x10 w/ slider and strap  Supine quad set 2x10 (second  set with quad) HEP handout + education  Therapeutic Activity: Medial/lateral weight shift w/ RW x12 Modified tandem AP weight shift w/ RW x10 STS transfers training x3 w RW cues for sequencing and mechanics                                            PATIENT EDUCATION:  Education details: HEP update  Person educated: Patient Education method: Explanation, Demonstration, Tactile cues, Verbal cues, handout  Education comprehension: verbalized understanding, returned demonstration, verbal cues required, tactile cues required, and needs further education     HOME EXERCISE PROGRAM: Access Code: [email or web address] URL: [email or web address] Date: 12/21/2023 Prepared by: Letitia Libra  Exercises - Supine Heel Slide  - 2-3 x daily - 7 x weekly - 1 sets - 10 reps - 5 sec  hold - Side to Side Weight Shift with Counter Support  - 2-3 x daily - 7 x weekly - 2 sets - 10 reps - Seated Knee Flexion AAROM  - 2-3 x daily - 7 x weekly - 1 sets - 10 reps - 5 sec  hold - Supine Knee Extension Strengthening  - 1 x daily - 7 x weekly - 2 sets - 10 reps - Seated Long Arc Quad  - 1 x daily - 7 x weekly - 2 sets - 10 reps - Hooklying Clamshell with Resistance  - 1 x daily - 7 x weekly - 2 sets - 10 reps  ASSESSMENT:  CLINICAL IMPRESSION: Rt knee ROM continues to improve achieving 86 degrees of flexion and lacking 1 degree of extension today. Improved quad activation noted with quad set compared to previous session. Worked on gait training, progressing to quad cane. With cane patient required CGA due to mild instability with step to pattern. Was encouraged to continue to utilize RW for all ambulation due to current unsteadiness with quad cane with patient verbalizing understanding.   Per eval - Patient is a 68 y.o. female who was seen today for physical therapy evaluation and treatment for s/p Rt TKA on 11/11/23. She has very limited functional mobility currently requiring assistance with transfers and CGA for short duration ambulation. She demonstrates Rt knee ROM, Rt knee strength, gait and balance deficits. She will benefit from skilled PT to address the above stated deficits in order to return to optimal function,  though we did discuss that she reach out to referring provider as she might be appropriate for home health PT currently given her limited mobility and ability to attend outpatient PT.   OBJECTIVE IMPAIRMENTS: Abnormal gait, decreased activity tolerance, decreased balance, decreased endurance, decreased knowledge of condition, decreased mobility, difficulty walking, decreased ROM, decreased strength, increased edema, improper body mechanics, and pain.   ACTIVITY LIMITATIONS: carrying, lifting, bending, standing, squatting, sleeping, stairs, transfers, bed mobility, bathing, dressing, hygiene/grooming, and locomotion level  PARTICIPATION LIMITATIONS: meal prep, cleaning, laundry, driving, shopping, community activity, and yard work  PERSONAL FACTORS: Age, Fitness, and 3+ comorbidities: see PMH above   are also affecting patient's functional outcome.   REHAB POTENTIAL: Good  CLINICAL DECISION MAKING: Unstable/unpredictable  EVALUATION COMPLEXITY: Moderate   GOALS: Goals reviewed with patient? Yes  SHORT TERM GOALS: Target date: 12/13/2023   Patient will be independent and compliant with initial HEP.   Baseline:  issued at eval  Goal status: MET   2.  Patient will demonstrate at least 90 degrees of Rt  knee flexion AROM to improve ability to complete sit to stand transfers.  Baseline: see above Goal status: progressing    3.  Patient will improve Rt knee extension AROM by 10 degrees to improve gait mechanics.  Baseline: see above  Goal status: MET  4.  Patient will be independent with sit <> supine transfers.  Baseline: Min A  12/14/23: independent with sit to supine transfer  Goal status: MET    LONG TERM GOALS: Target date: 01/15/24  Patient will score >/= 55/100 interval score on the KOOS JR (MCID is 14) to signify clinically meaningful improvement in functional abilities.   Baseline: see above  Goal status: INITIAL  2.  Patient will be able to ascend/descend stairs Mod I  with handrail use.  Baseline: requires assistance  Goal status: INITIAL  3.  Patient will be able to ambulate community distances with LRAD.  Baseline: short household distances with RW  Goal status: INITIAL  4.  Patient will demonstrate at least 110 degrees of Rt knee flexion AROM to improve ability to complete sit to stand transfers.  Baseline: see above Goal status: INITIAL  5.  Patient will report pain at worst rated as </= 3/10 to reduce current functional limitations.  Baseline: 9 Goal status: INITIAL   PLAN:  PT FREQUENCY: 2x/week  PT DURATION: 8 weeks  PLANNED INTERVENTIONS: 97164- PT Re-evaluation, 97110-Therapeutic exercises, 97530- Therapeutic activity, [id number]- Neuromuscular re-education, 97535- Self Care, 29528- Manual therapy, [id number]- Gait training, [id number]- Aquatic Therapy, 97014- Electrical stimulation (unattended), [id number]- Electrical stimulation (manual), 97016- Vasopneumatic device, Dry Needling, Cryotherapy, and Moist heat  PLAN FOR NEXT SESSION: review and progress HEP prn. Rt knee ROM and strengthening; gait training.   Letitia Libra, PT, DPT, ATC 12/21/23 9:32 AM

## 2023-12-28 ENCOUNTER — Ambulatory Visit

## 2023-12-28 DIAGNOSIS — R600 Localized edema: Secondary | ICD-10-CM

## 2023-12-28 DIAGNOSIS — M25661 Stiffness of right knee, not elsewhere classified: Secondary | ICD-10-CM

## 2023-12-28 DIAGNOSIS — M25561 Pain in right knee: Secondary | ICD-10-CM

## 2023-12-28 DIAGNOSIS — M6281 Muscle weakness (generalized): Secondary | ICD-10-CM

## 2023-12-28 DIAGNOSIS — R2689 Other abnormalities of gait and mobility: Secondary | ICD-10-CM

## 2023-12-28 NOTE — Therapy (Signed)
 OUTPATIENT PHYSICAL THERAPY TREATMENT   Patient Name: Terri Nicholson MRN: [id number] DOB:1956-03-03, 68 y.o., female Today's Date: 12/28/2023  END OF SESSION:  PT End of Session - 12/28/23 0800     Visit Number 5    Number of Visits 17    Date for PT Re-Evaluation 01/15/24    Authorization Type [employer]    PT Start Time 0800    PT Stop Time 0841    PT Time Calculation (min) 41 min    Activity Tolerance Patient tolerated treatment well                 Past Medical History:  Diagnosis Date   Arthritis    Asthma    Diabetes mellitus without complication (HCC)    Hypertension    IFG (impaired fasting glucose)    Past Surgical History:  Procedure Laterality Date   CARDIAC SURGERY     CESAREAN SECTION     EYE SURGERY     HERNIA REPAIR     Patient Active Problem List   Diagnosis Date Noted   Uncontrolled type 2 diabetes mellitus with hyperglycemia (HCC) 12/14/2018   Complete rotator cuff tear 01/05/2018   Palpitations 03/04/2017   DIABETES MELLITUS, TYPE II 09/19/2007   HYPERTENSION 09/19/2007    PCP: Johny Blamer, MD  REFERRING PROVIDER: Marcene Corning, MD  REFERRING DIAG: s/p Rt total knee replacement   THERAPY DIAG:  Acute pain of right knee  Stiffness of right knee, not elsewhere classified  Localized edema  Muscle weakness (generalized)  Other abnormalities of gait and mobility  Rationale for Evaluation and Treatment: Rehabilitation  ONSET DATE: 11/11/23  SUBJECTIVE:   Per eval - Patient underwent Rt TKA on 11/11/23. She had a fall on Saturday when trying to transfer off the toilet. She had X-rays taken after the fall and everything looked fine. Patient reports she is walking several times daily with her RW for short distances. She has been completing some of the exercises provided post-op, but they are "rough." Patient reports difficulty with walking, stairs, transfers, and putting weight on the RLE. She currently needs assistance with sit  to stand transfers and it was really difficult getting in/out of the car today.   SUBJECTIVE STATEMENT: Patient reports the knee is tight this morning. Has been working on her walking. Had f/u with surgeon who is pleased with her progress thus far.   PERTINENT HISTORY: Diabetes   PAIN:  Are you having pain? Yes: NPRS scale: 2/10 Pain location: Rt knee Pain description: tight Aggravating factors: walking, transfers  Relieving factors: ice, pain medication   PRECAUTIONS: Fall  RED FLAGS: None   WEIGHT BEARING RESTRICTIONS: Yes WBAT RLE  FALLS:  Has patient fallen in last 6 months? Yes. Number of falls 5; mostly from tripping, most recent fall post-op when she was getting off the toilet  LIVING ENVIRONMENT: Lives with: lives with their spouse Lives in: House/apartment Stairs: Yes: Internal: flight steps; on left going up and External: 3 steps; can reach both Has following equipment at home: Single point cane, Walker - 2 wheeled, and Wheelchair (manual)  OCCUPATION: retired   PLOF: Independent  PATIENT GOALS: "to be able to have ADLs that are normal. Be functionally independent."   NEXT MD VISIT: 01/27/24  OBJECTIVE:  Note: Objective measures were completed at Evaluation unless otherwise noted.  DIAGNOSTIC FINDINGS: no recent imaging on file   PATIENT SURVEYS:  KOOS JR: 36/100 interval; 20/28   COGNITION: Overall cognitive status: Within functional  limits for tasks assessed     SENSATION: Not tested  EDEMA:  Bandages donned, though moderate edema present about the Rt knee    POSTURE:  maintains RLE in extension in seated   PALPATION: Not assessed   LOWER EXTREMITY ROM:  Active ROM Right eval Left eval Right 11/30/23 12/21/23 Right  12/28/23 Right   Hip flexion       Hip extension       Hip abduction       Hip adduction       Hip internal rotation       Hip external rotation       Knee flexion 60  A: 89 deg seated 86 95  Knee extension Lacking 13    Lacking 1  0  Ankle dorsiflexion       Ankle plantarflexion       Ankle inversion       Ankle eversion        (Blank rows = not tested)  LOWER EXTREMITY MMT:  MMT Right eval Left eval  Hip flexion    Hip extension    Hip abduction    Hip adduction    Hip internal rotation    Hip external rotation    Knee flexion    Knee extension 3-   Ankle dorsiflexion    Ankle plantarflexion    Ankle inversion    Ankle eversion     (Blank rows = not tested)   FUNCTIONAL TESTS:  Sit <> stand CGA Sit <> Supine Min A    GAIT: Distance walked: 5 ft  Assistive device utilized: Environmental consultant - 2 wheeled Level of assistance: CGA Comments: step to pattern, heavy use of UE support [facility] Adult PT Treatment:                                                DATE: 12/28/23 Therapeutic Exercise: NuStep level 5 x 5 minutes UE/LE  Heel slides on physioball x 10  Standing calf raise 2 x 10   Neuromuscular re-ed: LAQ 2 x 10  SAQ 2 x 10  TKE green band 2 x 10   Self care: Falls checklist for home, handout provided.  Gait training: With SPC step through pattern CGA to Mod I- multiple laps in clinic  Focusing on pattern and cane placement   [facility] Adult PT Treatment:                                                DATE: 12/21/23 Therapeutic Exercise: NuStep level 4 x 5 minutes UE/LE  Hooklying hip abduction 2 x 10 green band 2 x 10  Updated HEP   Neuromuscular re-ed: Quad set x 10; 5 sec hold  SAQ 2 x 10  LAQ 2 x 10  Gait training: With RW focusing on heel strike and increasing step length- 2 laps in clinic  With quad cane step through pattern CGA- 1 lap in clinic    [facility] Adult PT Treatment:  DATE: 12/14/23 Therapeutic Exercise: NuStep level 4 x 5 minutes UE/LE  Supine heel slides with strap x 10; 5 sec hold  Calf stretch with strap 2 x 30 sec   Gait training: With RW focusing on heel strike and allowing for knee flexion during swing and widening  stance- 3 laps in clinic with rest break in between  Neuromuscular re-education: Quad sets with heavy cues for quad activation 2 x 10; 5 sec hold  LAQ 2 x 10                                             PATIENT EDUCATION:  Education details: HEP review; falls check list   Person educated: Patient Education method: Explanation, Demonstration, Tactile cues, Verbal cues, handout  Education comprehension: verbalized understanding, returned demonstration, verbal cues required, tactile cues required, and needs further education     HOME EXERCISE PROGRAM: Access Code: [email or web address] URL: [email or web address] Date: 12/21/2023 Prepared by: Letitia Libra  Exercises - Supine Heel Slide  - 2-3 x daily - 7 x weekly - 1 sets - 10 reps - 5 sec  hold - Side to Side Weight Shift with Counter Support  - 2-3 x daily - 7 x weekly - 2 sets - 10 reps - Seated Knee Flexion AAROM  - 2-3 x daily - 7 x weekly - 1 sets - 10 reps - 5 sec  hold - Supine Knee Extension Strengthening  - 1 x daily - 7 x weekly - 2 sets - 10 reps - Seated Long Arc Quad  - 1 x daily - 7 x weekly - 2 sets - 10 reps - Hooklying Clamshell with Resistance  - 1 x daily - 7 x weekly - 2 sets - 10 reps  ASSESSMENT:  CLINICAL IMPRESSION: Patient noted to have improved gait stability when ambulating with SPC in clinic. She is able to demonstrate step through pattern independently with SPC without LOB. She was encouraged to begin household ambulation with [facility] with patient verbalizing understanding. Knee flexion AROM continues to improve, having met this STG. Good tolerance to strength progression without onset of pain.   Per eval - Patient is a 68 y.o. female who was seen today for physical therapy evaluation and treatment for s/p Rt TKA on 11/11/23. She has very limited functional mobility currently requiring assistance with transfers and CGA for short duration ambulation. She demonstrates Rt knee ROM, Rt knee strength, gait and  balance deficits. She will benefit from skilled PT to address the above stated deficits in order to return to optimal function, though we did discuss that she reach out to referring provider as she might be appropriate for home health PT currently given her limited mobility and ability to attend outpatient PT.   OBJECTIVE IMPAIRMENTS: Abnormal gait, decreased activity tolerance, decreased balance, decreased endurance, decreased knowledge of condition, decreased mobility, difficulty walking, decreased ROM, decreased strength, increased edema, improper body mechanics, and pain.   ACTIVITY LIMITATIONS: carrying, lifting, bending, standing, squatting, sleeping, stairs, transfers, bed mobility, bathing, dressing, hygiene/grooming, and locomotion level  PARTICIPATION LIMITATIONS: meal prep, cleaning, laundry, driving, shopping, community activity, and yard work  PERSONAL FACTORS: Age, Fitness, and 3+ comorbidities: see PMH above   are also affecting patient's functional outcome.   REHAB POTENTIAL: Good  CLINICAL DECISION MAKING: Unstable/unpredictable  EVALUATION COMPLEXITY: Moderate   GOALS: Goals reviewed with patient? Yes  SHORT  TERM GOALS: Target date: 12/13/2023   Patient will be independent and compliant with initial HEP.   Baseline:  issued at eval  Goal status: MET   2.  Patient will demonstrate at least 90 degrees of Rt knee flexion AROM to improve ability to complete sit to stand transfers.  Baseline: see above Goal status: MET    3.  Patient will improve Rt knee extension AROM by 10 degrees to improve gait mechanics.  Baseline: see above  Goal status: MET  4.  Patient will be independent with sit <> supine transfers.  Baseline: Min A  12/14/23: independent with sit to supine transfer  Goal status: MET    LONG TERM GOALS: Target date: 01/15/24  Patient will score >/= 55/100 interval score on the KOOS JR (MCID is 14) to signify clinically meaningful improvement in functional  abilities.   Baseline: see above  Goal status: INITIAL  2.  Patient will be able to ascend/descend stairs Mod I with handrail use.  Baseline: requires assistance  Goal status: INITIAL  3.  Patient will be able to ambulate community distances with LRAD.  Baseline: short household distances with RW  Goal status: INITIAL  4.  Patient will demonstrate at least 110 degrees of Rt knee flexion AROM to improve ability to complete sit to stand transfers.  Baseline: see above Goal status: INITIAL  5.  Patient will report pain at worst rated as </= 3/10 to reduce current functional limitations.  Baseline: 9 Goal status: INITIAL   PLAN:  PT FREQUENCY: 2x/week  PT DURATION: 8 weeks  PLANNED INTERVENTIONS: 97164- PT Re-evaluation, 97110-Therapeutic exercises, 97530- Therapeutic activity, [id number]- Neuromuscular re-education, 97535- Self Care, 11914- Manual therapy, [id number]- Gait training, [id number]- Aquatic Therapy, 97014- Electrical stimulation (unattended), [id number]- Electrical stimulation (manual), 97016- Vasopneumatic device, Dry Needling, Cryotherapy, and Moist heat  PLAN FOR NEXT SESSION: review and progress HEP prn. Rt knee ROM and strengthening; gait training.   Letitia Libra, PT, DPT, ATC 12/28/23 8:42 AM

## 2024-01-04 ENCOUNTER — Ambulatory Visit

## 2024-01-04 DIAGNOSIS — M25561 Pain in right knee: Secondary | ICD-10-CM | POA: Diagnosis not present

## 2024-01-04 DIAGNOSIS — M6281 Muscle weakness (generalized): Secondary | ICD-10-CM

## 2024-01-04 DIAGNOSIS — R600 Localized edema: Secondary | ICD-10-CM

## 2024-01-04 DIAGNOSIS — M25661 Stiffness of right knee, not elsewhere classified: Secondary | ICD-10-CM

## 2024-01-04 DIAGNOSIS — R2689 Other abnormalities of gait and mobility: Secondary | ICD-10-CM

## 2024-01-04 NOTE — Therapy (Signed)
 OUTPATIENT PHYSICAL THERAPY TREATMENT   Patient Name: Terri Nicholson MRN: [id number] DOB:[birth year], 68 y.o., female Today's Date: 01/04/2024  END OF SESSION:  PT End of Session - 01/04/24 0801     Visit Number 6    Number of Visits 17    Date for PT Re-Evaluation 01/15/24    Authorization Type [employer]    PT Start Time 0801    PT Stop Time 0841    PT Time Calculation (min) 40 min    Activity Tolerance Patient tolerated treatment well                  Past Medical History:  Diagnosis Date   Arthritis    Asthma    Diabetes mellitus without complication (HCC)    Hypertension    IFG (impaired fasting glucose)    Past Surgical History:  Procedure Laterality Date   CARDIAC SURGERY     CESAREAN SECTION     EYE SURGERY     HERNIA REPAIR     Patient Active Problem List   Diagnosis Date Noted   Uncontrolled type 2 diabetes mellitus with hyperglycemia (HCC) 12/14/2018   Complete rotator cuff tear 01/05/2018   Palpitations 03/04/2017   DIABETES MELLITUS, TYPE II 09/19/2007   HYPERTENSION 09/19/2007    PCP: Johny Blamer, MD  REFERRING PROVIDER: Marcene Corning, MD  REFERRING DIAG: s/p Rt total knee replacement   THERAPY DIAG:  Acute pain of right knee  Stiffness of right knee, not elsewhere classified  Localized edema  Muscle weakness (generalized)  Other abnormalities of gait and mobility  Rationale for Evaluation and Treatment: Rehabilitation  ONSET DATE: 11/11/23  SUBJECTIVE:   Per eval - Patient underwent Rt TKA on 11/11/23. She had a fall on Saturday when trying to transfer off the toilet. She had X-rays taken after the fall and everything looked fine. Patient reports she is walking several times daily with her RW for short distances. She has been completing some of the exercises provided post-op, but they are "rough." Patient reports difficulty with walking, stairs, transfers, and putting weight on the RLE. She currently needs assistance with  sit to stand transfers and it was really difficult getting in/out of the car today.   SUBJECTIVE STATEMENT: "A little stiff this morning."    PERTINENT HISTORY: Diabetes   PAIN:  Are you having pain? Yes: NPRS scale: 3/10 Pain location: Rt knee Pain description: tight,sore Aggravating factors: walking, transfers  Relieving factors: ice, pain medication   PRECAUTIONS: Fall  RED FLAGS: None   WEIGHT BEARING RESTRICTIONS: Yes WBAT RLE  FALLS:  Has patient fallen in last 6 months? Yes. Number of falls 5; mostly from tripping, most recent fall post-op when she was getting off the toilet  LIVING ENVIRONMENT: Lives with: lives with their spouse Lives in: House/apartment Stairs: Yes: Internal: flight steps; on left going up and External: 3 steps; can reach both Has following equipment at home: Single point cane, Walker - 2 wheeled, and Wheelchair (manual)  OCCUPATION: retired   PLOF: Independent  PATIENT GOALS: "to be able to have ADLs that are normal. Be functionally independent."   NEXT MD VISIT: 01/27/24  OBJECTIVE:  Note: Objective measures were completed at Evaluation unless otherwise noted.  DIAGNOSTIC FINDINGS: no recent imaging on file   PATIENT SURVEYS:  KOOS JR: 36/100 interval; 20/28   COGNITION: Overall cognitive status: Within functional limits for tasks assessed     SENSATION: Not tested  EDEMA:  Bandages donned, though moderate edema  present about the Rt knee    POSTURE:  maintains RLE in extension in seated   PALPATION: Not assessed   LOWER EXTREMITY ROM:  Active ROM Right eval Left eval Right 11/30/23 12/21/23 Right  12/28/23 Right   Hip flexion       Hip extension       Hip abduction       Hip adduction       Hip internal rotation       Hip external rotation       Knee flexion 60  A: 89 deg seated 86 95  Knee extension Lacking 13   Lacking 1  0  Ankle dorsiflexion       Ankle plantarflexion       Ankle inversion       Ankle  eversion        (Blank rows = not tested)  LOWER EXTREMITY MMT:  MMT Right eval Left eval  Hip flexion    Hip extension    Hip abduction    Hip adduction    Hip internal rotation    Hip external rotation    Knee flexion    Knee extension 3-   Ankle dorsiflexion    Ankle plantarflexion    Ankle inversion    Ankle eversion     (Blank rows = not tested)   FUNCTIONAL TESTS:  Sit <> stand CGA Sit <> Supine Min A    GAIT: Distance walked: 5 ft  Assistive device utilized: Environmental consultant - 2 wheeled Level of assistance: CGA Comments: step to pattern, heavy use of UE support [facility] Adult PT Treatment:                                                DATE: 01/04/24 Therapeutic Exercise: NuStep level 5 x 5 minutes UE/LE  Standing HS curl 2 x 10  Standing calf raise 2 x 10  LAQ 2 x 10 @ 1 lb   Therapeutic Activity: Sit to stand from raised height holding ball 2 x 5, x 3  Side stepping at counter 2 sets d/b  Gait training: With SPC/quad cane step through pattern multiple laps in clinic  Focusing on allowing for knee flexion and cane placement     [facility] Adult PT Treatment:                                                DATE: 12/28/23 Therapeutic Exercise: NuStep level 5 x 5 minutes UE/LE  Heel slides on physioball x 10  Standing calf raise 2 x 10   Neuromuscular re-ed: LAQ 2 x 10  SAQ 2 x 10  TKE green band 2 x 10   Self care: Falls checklist for home, handout provided.  Gait training: With SPC step through pattern CGA to Mod I- multiple laps in clinic  Focusing on pattern and cane placement   [facility] Adult PT Treatment:                                                DATE: 12/21/23 Therapeutic Exercise: NuStep level 4  x 5 minutes UE/LE  Hooklying hip abduction 2 x 10 green band 2 x 10  Updated HEP   Neuromuscular re-ed: Quad set x 10; 5 sec hold  SAQ 2 x 10  LAQ 2 x 10  Gait training: With RW focusing on heel strike and increasing step length- 2 laps in clinic  With quad  cane step through pattern CGA- 1 lap in clinic                                             PATIENT EDUCATION:  Education details: HEP update Person educated: Patient Education method: Explanation, Demonstration, Tactile cues, Verbal cues, handout  Education comprehension: verbalized understanding, returned demonstration, verbal cues required, tactile cues required, and needs further education     HOME EXERCISE PROGRAM: Access Code: [email or web address] URL: [email or web address] Date: 01/04/2024 Prepared by: Letitia Libra  Exercises - Supine Heel Slide  - 2-3 x daily - 7 x weekly - 1 sets - 10 reps - 5 sec  hold - Seated Knee Flexion AAROM  - 2-3 x daily - 7 x weekly - 1 sets - 10 reps - 5 sec  hold - Supine Knee Extension Strengthening  - 1 x daily - 7 x weekly - 2 sets - 10 reps - Seated Long Arc Quad  - 1 x daily - 7 x weekly - 2 sets - 10 reps - Hooklying Clamshell with Resistance  - 1 x daily - 7 x weekly - 2 sets - 10 reps - Sit to Stand Without Arm Support  - 1 x daily - 7 x weekly - 3 sets - 5 reps - Standing Knee Flexion AROM with Chair Support  - 1 x daily - 7 x weekly - 2 sets - 10 reps - Heel Raises with Counter Support  - 1 x daily - 7 x weekly - 2 sets - 10 reps  ASSESSMENT:  CLINICAL IMPRESSION: Patient initially ambulates with limited knee flexion on the RLE and step to pattern when entering clinic with quad cane. Following gait training she demonstrates improved knee flexion with step through pattern utilizing quad cane. Worked on sit to stand with patient able to complete from raised height without use of UE support, though does have difficulty controlling eccentric phase. Good tolerance to standing strength progression.   Per eval - Patient is a 68 y.o. female who was seen today for physical therapy evaluation and treatment for s/p Rt TKA on 11/11/23. She has very limited functional mobility currently requiring assistance with transfers and CGA for short duration  ambulation. She demonstrates Rt knee ROM, Rt knee strength, gait and balance deficits. She will benefit from skilled PT to address the above stated deficits in order to return to optimal function, though we did discuss that she reach out to referring provider as she might be appropriate for home health PT currently given her limited mobility and ability to attend outpatient PT.   OBJECTIVE IMPAIRMENTS: Abnormal gait, decreased activity tolerance, decreased balance, decreased endurance, decreased knowledge of condition, decreased mobility, difficulty walking, decreased ROM, decreased strength, increased edema, improper body mechanics, and pain.   ACTIVITY LIMITATIONS: carrying, lifting, bending, standing, squatting, sleeping, stairs, transfers, bed mobility, bathing, dressing, hygiene/grooming, and locomotion level  PARTICIPATION LIMITATIONS: meal prep, cleaning, laundry, driving, shopping, community activity, and yard work  PERSONAL FACTORS: Age, Fitness, and 3+ comorbidities: see PMH above  are also affecting patient's functional outcome.   REHAB POTENTIAL: Good  CLINICAL DECISION MAKING: Unstable/unpredictable  EVALUATION COMPLEXITY: Moderate   GOALS: Goals reviewed with patient? Yes  SHORT TERM GOALS: Target date: 12/13/2023   Patient will be independent and compliant with initial HEP.   Baseline:  issued at eval  Goal status: MET   2.  Patient will demonstrate at least 90 degrees of Rt knee flexion AROM to improve ability to complete sit to stand transfers.  Baseline: see above Goal status: MET    3.  Patient will improve Rt knee extension AROM by 10 degrees to improve gait mechanics.  Baseline: see above  Goal status: MET  4.  Patient will be independent with sit <> supine transfers.  Baseline: Min A  12/14/23: independent with sit to supine transfer  Goal status: MET    LONG TERM GOALS: Target date: 01/15/24  Patient will score >/= 55/100 interval score on the KOOS JR  (MCID is 14) to signify clinically meaningful improvement in functional abilities.   Baseline: see above  Goal status: INITIAL  2.  Patient will be able to ascend/descend stairs Mod I with handrail use.  Baseline: requires assistance  Goal status: INITIAL  3.  Patient will be able to ambulate community distances with LRAD.  Baseline: short household distances with RW  Goal status: INITIAL  4.  Patient will demonstrate at least 110 degrees of Rt knee flexion AROM to improve ability to complete sit to stand transfers.  Baseline: see above Goal status: INITIAL  5.  Patient will report pain at worst rated as </= 3/10 to reduce current functional limitations.  Baseline: 9 Goal status: INITIAL   PLAN:  PT FREQUENCY: 2x/week  PT DURATION: 8 weeks  PLANNED INTERVENTIONS: 97164- PT Re-evaluation, 97110-Therapeutic exercises, 97530- Therapeutic activity, [id number]- Neuromuscular re-education, 97535- Self Care, [id number]- Manual therapy, [id number]- Gait training, [id number]- Aquatic Therapy, 97014- Electrical stimulation (unattended), [id number]- Electrical stimulation (manual), 97016- Vasopneumatic device, Dry Needling, Cryotherapy, and Moist heat  PLAN FOR NEXT SESSION: review and progress HEP prn. Rt knee ROM and strengthening; gait training.   Letitia Libra, PT, DPT, ATC 01/04/24 8:43 AM

## 2024-01-11 ENCOUNTER — Ambulatory Visit: Attending: Orthopaedic Surgery

## 2024-01-11 DIAGNOSIS — M25561 Pain in right knee: Secondary | ICD-10-CM | POA: Diagnosis present

## 2024-01-11 DIAGNOSIS — M6281 Muscle weakness (generalized): Secondary | ICD-10-CM | POA: Insufficient documentation

## 2024-01-11 DIAGNOSIS — R600 Localized edema: Secondary | ICD-10-CM | POA: Diagnosis present

## 2024-01-11 DIAGNOSIS — R2689 Other abnormalities of gait and mobility: Secondary | ICD-10-CM | POA: Insufficient documentation

## 2024-01-11 DIAGNOSIS — M25661 Stiffness of right knee, not elsewhere classified: Secondary | ICD-10-CM | POA: Insufficient documentation

## 2024-01-11 NOTE — Therapy (Signed)
 OUTPATIENT PHYSICAL THERAPY TREATMENT RE-CERTIFICATION   Patient Name: Terri Nicholson MRN: [id number] DOB:Mar 29, 1956, 68 y.o., female Today's Date: 01/11/2024  END OF SESSION:  PT End of Session - 01/11/24 0759     Visit Number 7    Number of Visits 13    Date for PT Re-Evaluation 02/26/24    Authorization Type [employer]    PT Start Time 0803    PT Stop Time 0843    PT Time Calculation (min) 40 min    Activity Tolerance Patient tolerated treatment well                   Past Medical History:  Diagnosis Date   Arthritis    Asthma    Diabetes mellitus without complication (HCC)    Hypertension    IFG (impaired fasting glucose)    Past Surgical History:  Procedure Laterality Date   CARDIAC SURGERY     CESAREAN SECTION     EYE SURGERY     HERNIA REPAIR     Patient Active Problem List   Diagnosis Date Noted   Uncontrolled type 2 diabetes mellitus with hyperglycemia (HCC) 12/14/2018   Complete rotator cuff tear 01/05/2018   Palpitations 03/04/2017   DIABETES MELLITUS, TYPE II 09/19/2007   HYPERTENSION 09/19/2007    PCP: Johny Blamer, MD  REFERRING PROVIDER: Marcene Corning, MD  REFERRING DIAG: s/p Rt total knee replacement   THERAPY DIAG:  Acute pain of right knee  Stiffness of right knee, not elsewhere classified  Localized edema  Muscle weakness (generalized)  Other abnormalities of gait and mobility  Rationale for Evaluation and Treatment: Rehabilitation  ONSET DATE: 11/11/23  SUBJECTIVE:   Per eval - Patient underwent Rt TKA on 11/11/23. She had a fall on Saturday when trying to transfer off the toilet. She had X-rays taken after the fall and everything looked fine. Patient reports she is walking several times daily with her RW for short distances. She has been completing some of the exercises provided post-op, but they are "rough." Patient reports difficulty with walking, stairs, transfers, and putting weight on the RLE. She currently needs  assistance with sit to stand transfers and it was really difficult getting in/out of the car today.   SUBJECTIVE STATEMENT: Patient reports she still has a little bit of pain. Probably from walking more.   PERTINENT HISTORY: Diabetes   PAIN:  Are you having pain? Yes: NPRS scale: 3/10 Pain location: Rt knee Pain description: tight,sore Aggravating factors: walking, transfers  Relieving factors: ice, pain medication   PRECAUTIONS: Fall  RED FLAGS: None   WEIGHT BEARING RESTRICTIONS: Yes WBAT RLE  FALLS:  Has patient fallen in last 6 months? Yes. Number of falls 5; mostly from tripping, most recent fall post-op when she was getting off the toilet  LIVING ENVIRONMENT: Lives with: lives with their spouse Lives in: House/apartment Stairs: Yes: Internal: flight steps; on left going up and External: 3 steps; can reach both Has following equipment at home: Single point cane, Walker - 2 wheeled, and Wheelchair (manual)  OCCUPATION: retired   PLOF: Independent  PATIENT GOALS: "to be able to have ADLs that are normal. Be functionally independent."   NEXT MD VISIT: 01/27/24  OBJECTIVE:  Note: Objective measures were completed at Evaluation unless otherwise noted.  DIAGNOSTIC FINDINGS: no recent imaging on file   PATIENT SURVEYS:  KOOS JR: 36/100 interval; 20/28   01/11/24: KOOS JR: 7/28; 68/100  COGNITION: Overall cognitive status: Within functional limits for tasks  assessed     SENSATION: Not tested  EDEMA:  Bandages donned, though moderate edema present about the Rt knee    POSTURE:  maintains RLE in extension in seated   PALPATION: Not assessed   LOWER EXTREMITY ROM:  Active ROM Right eval Left eval Right 11/30/23 12/21/23 Right  12/28/23 Right  01/11/24 Right   Hip flexion        Hip extension        Hip abduction        Hip adduction        Hip internal rotation        Hip external rotation        Knee flexion 60  A: 89 deg seated 86 95 99  Knee extension  Lacking 13   Lacking 1  0 0  Ankle dorsiflexion        Ankle plantarflexion        Ankle inversion        Ankle eversion         (Blank rows = not tested)  LOWER EXTREMITY MMT:  MMT Right eval Left eval 01/11/24 Right   Hip flexion     Hip extension     Hip abduction     Hip adduction     Hip internal rotation     Hip external rotation     Knee flexion   5  Knee extension 3-  4  Ankle dorsiflexion     Ankle plantarflexion     Ankle inversion     Ankle eversion      (Blank rows = not tested)   FUNCTIONAL TESTS:  Sit <> stand CGA Sit <> Supine Min A    GAIT: Distance walked: 5 ft  Assistive device utilized: Environmental consultant - 2 wheeled Level of assistance: CGA Comments: step to pattern, heavy use of UE support [facility] Adult PT Treatment:                                                DATE: 01/11/24 Therapeutic Exercise: Standing hip abduction  2 x 10  Standing hip extension 2 x 10  Calf raises 2 x 10  NuStep level 5 LE only x 5 minutes   Therapeutic Activity: Re-assessment to determine overall progress, educating patient on progress towards goals.  Sit to stand 2 x 5  Step ups 4 inch x 10 double UE support, x 10 single UE support  Step taps 2 x 10 6 inch step    [facility] Adult PT Treatment:                                                DATE: 01/04/24 Therapeutic Exercise: NuStep level 5 x 5 minutes UE/LE  Standing HS curl 2 x 10  Standing calf raise 2 x 10  LAQ 2 x 10 @ 1 lb   Therapeutic Activity: Sit to stand from raised height holding ball 2 x 5, x 3  Side stepping at counter 2 sets d/b  Gait training: With SPC/quad cane step through pattern multiple laps in clinic  Focusing on allowing for knee flexion and cane placement     [facility] Adult PT Treatment:  DATE: 12/28/23 Therapeutic Exercise: NuStep level 5 x 5 minutes UE/LE  Heel slides on physioball x 10  Standing calf raise 2 x 10   Neuromuscular re-ed: LAQ 2 x 10   SAQ 2 x 10  TKE green band 2 x 10   Self care: Falls checklist for home, handout provided.  Gait training: With SPC step through pattern CGA to Mod I- multiple laps in clinic  Focusing on pattern and cane placement   [facility] Adult PT Treatment:                                                DATE: 12/21/23 Therapeutic Exercise: NuStep level 4 x 5 minutes UE/LE  Hooklying hip abduction 2 x 10 green band 2 x 10  Updated HEP   Neuromuscular re-ed: Quad set x 10; 5 sec hold  SAQ 2 x 10  LAQ 2 x 10  Gait training: With RW focusing on heel strike and increasing step length- 2 laps in clinic  With quad cane step through pattern CGA- 1 lap in clinic                                             PATIENT EDUCATION:  Education details: HEP update; See treatment  Person educated: Patient Education method: Explanation, Demonstration, Tactile cues, Verbal cues, handout  Education comprehension: verbalized understanding, returned demonstration, verbal cues required, tactile cues required, and needs further education     HOME EXERCISE PROGRAM: Access Code: [email or web address] URL: [email or web address] Date: 01/11/2024 Prepared by: Letitia Libra  Exercises - Supine Heel Slide  - 2-3 x daily - 7 x weekly - 1 sets - 10 reps - 5 sec  hold - Seated Knee Flexion AAROM  - 2-3 x daily - 7 x weekly - 1 sets - 10 reps - 5 sec  hold - Seated Long Arc Quad  - 1 x daily - 7 x weekly - 2 sets - 10 reps - Hooklying Clamshell with Resistance  - 1 x daily - 7 x weekly - 2 sets - 10 reps - Sit to Stand Without Arm Support  - 1 x daily - 7 x weekly - 3 sets - 5 reps - Standing Knee Flexion AROM with Chair Support  - 1 x daily - 7 x weekly - 2 sets - 10 reps - Heel Raises with Counter Support  - 1 x daily - 7 x weekly - 2 sets - 10 reps - Standing Hip Abduction with Counter Support  - 1 x daily - 7 x weekly - 2 sets - 10 reps - Standing Hip Extension with Counter Support  - 1 x daily - 7 x weekly - 2 sets  - 10 reps  ASSESSMENT:  CLINICAL IMPRESSION: Ruqayyah is making steady progress in PT s/p Rt TKA on 11/11/23. She demonstrates full Rt knee extension and is making steady progress in regards to Rt knee flexion AROM. Strength is improving in the knee, but demonstrates decreased functional strength reporting limitations with community ambulation and stair negotiation at this time. She will benefit from continued skilled PT 1 x week for 6 additional weeks to address lingering strength and ROM deficits in order to optimize her functional independence.   Per eval -  Patient is a 68 y.o. female who was seen today for physical therapy evaluation and treatment for s/p Rt TKA on 11/11/23. She has very limited functional mobility currently requiring assistance with transfers and CGA for short duration ambulation. She demonstrates Rt knee ROM, Rt knee strength, gait and balance deficits. She will benefit from skilled PT to address the above stated deficits in order to return to optimal function, though we did discuss that she reach out to referring provider as she might be appropriate for home health PT currently given her limited mobility and ability to attend outpatient PT.   OBJECTIVE IMPAIRMENTS: Abnormal gait, decreased activity tolerance, decreased balance, decreased endurance, decreased knowledge of condition, decreased mobility, difficulty walking, decreased ROM, decreased strength, increased edema, improper body mechanics, and pain.   ACTIVITY LIMITATIONS: carrying, lifting, bending, standing, squatting, sleeping, stairs, transfers, bed mobility, bathing, dressing, hygiene/grooming, and locomotion level  PARTICIPATION LIMITATIONS: meal prep, cleaning, laundry, driving, shopping, community activity, and yard work  PERSONAL FACTORS: Age, Fitness, and 3+ comorbidities: see PMH above   are also affecting patient's functional outcome.   REHAB POTENTIAL: Good  CLINICAL DECISION MAKING:  Unstable/unpredictable  EVALUATION COMPLEXITY: Moderate   GOALS: Goals reviewed with patient? Yes  SHORT TERM GOALS: Target date: 12/13/2023   Patient will be independent and compliant with initial HEP.   Baseline:  issued at eval  Goal status: MET   2.  Patient will demonstrate at least 90 degrees of Rt knee flexion AROM to improve ability to complete sit to stand transfers.  Baseline: see above Goal status: MET    3.  Patient will improve Rt knee extension AROM by 10 degrees to improve gait mechanics.  Baseline: see above  Goal status: MET  4.  Patient will be independent with sit <> supine transfers.  Baseline: Min A  12/14/23: independent with sit to supine transfer  Goal status: MET    LONG TERM GOALS: Target date: 02/26/24  Patient will score >/= 55/100 interval score on the KOOS JR (MCID is 14) to signify clinically meaningful improvement in functional abilities.   Baseline: see above  Goal status: MET  2.  Patient will be able to ascend/descend stairs Mod I with handrail use.  Baseline: requires assistance  01/11/24: requires supervision  Goal status: progressing   3.  Patient will be able to ambulate community distances with LRAD.  Baseline: short household distances with RW  01/11/24: reports limited mobility due to lack of endurance (mostly household walking currently)  Goal status: progressing   4.  Patient will demonstrate at least 110 degrees of Rt knee flexion AROM to improve ability to complete sit to stand transfers.  Baseline: see above Goal status: progressing   5.  Patient will report pain at worst rated as </= 3/10 to reduce current functional limitations.  Baseline: 9 01/11/24: 3  Goal status: MET    PLAN:  PT FREQUENCY: 1x/week  PT DURATION: 6 weeks  PLANNED INTERVENTIONS: 97164- PT Re-evaluation, 97110-Therapeutic exercises, 97530- Therapeutic activity, 97112- Neuromuscular re-education, 97535- Self Care, [id number]- Manual therapy, [id number]- Gait  training, [id number]- Aquatic Therapy, 97014- Electrical stimulation (unattended), [id number]- Electrical stimulation (manual), 97016- Vasopneumatic device, Dry Needling, Cryotherapy, and Moist heat  PLAN FOR NEXT SESSION: review and progress HEP prn. Rt knee ROM and strengthening; standing strengthening.   Letitia Libra, PT, DPT, ATC 01/11/24 8:41 AM

## 2024-01-18 ENCOUNTER — Ambulatory Visit

## 2024-01-18 DIAGNOSIS — R2689 Other abnormalities of gait and mobility: Secondary | ICD-10-CM

## 2024-01-18 DIAGNOSIS — M6281 Muscle weakness (generalized): Secondary | ICD-10-CM

## 2024-01-18 DIAGNOSIS — M25561 Pain in right knee: Secondary | ICD-10-CM | POA: Diagnosis not present

## 2024-01-18 DIAGNOSIS — R600 Localized edema: Secondary | ICD-10-CM

## 2024-01-18 DIAGNOSIS — M25661 Stiffness of right knee, not elsewhere classified: Secondary | ICD-10-CM

## 2024-01-18 NOTE — Therapy (Signed)
 OUTPATIENT PHYSICAL THERAPY TREATMENT    Patient Name: Terri Nicholson MRN: [id number] DOB:[birth year], 68 y.o., female Today's Date: 01/18/2024  END OF SESSION:  PT End of Session - 01/18/24 0801     Visit Number 8    Number of Visits 13    Date for PT Re-Evaluation 02/26/24    Authorization Type [employer]    PT Start Time 0801    PT Stop Time 0840    PT Time Calculation (min) 39 min    Equipment Utilized During Treatment Gait belt    Activity Tolerance Patient tolerated treatment well                    Past Medical History:  Diagnosis Date   Arthritis    Asthma    Diabetes mellitus without complication (HCC)    Hypertension    IFG (impaired fasting glucose)    Past Surgical History:  Procedure Laterality Date   CARDIAC SURGERY     CESAREAN SECTION     EYE SURGERY     HERNIA REPAIR     Patient Active Problem List   Diagnosis Date Noted   Uncontrolled type 2 diabetes mellitus with hyperglycemia (HCC) 12/14/2018   Complete rotator cuff tear 01/05/2018   Palpitations 03/04/2017   DIABETES MELLITUS, TYPE II 09/19/2007   HYPERTENSION 09/19/2007    PCP: Johny Blamer, MD  REFERRING PROVIDER: Marcene Corning, MD  REFERRING DIAG: s/p Rt total knee replacement   THERAPY DIAG:  Acute pain of right knee  Stiffness of right knee, not elsewhere classified  Localized edema  Muscle weakness (generalized)  Other abnormalities of gait and mobility  Rationale for Evaluation and Treatment: Rehabilitation  ONSET DATE: 11/11/23  SUBJECTIVE:   Per eval - Patient underwent Rt TKA on 11/11/23. She had a fall on Saturday when trying to transfer off the toilet. She had X-rays taken after the fall and everything looked fine. Patient reports she is walking several times daily with her RW for short distances. She has been completing some of the exercises provided post-op, but they are "rough." Patient reports difficulty with walking, stairs, transfers, and putting  weight on the RLE. She currently needs assistance with sit to stand transfers and it was really difficult getting in/out of the car today.   SUBJECTIVE STATEMENT:  "I think because it's been cold and rainy I feel it more."  PERTINENT HISTORY: Diabetes   PAIN:  Are you having pain? Yes: NPRS scale: 4/10 Pain location: Rt knee Pain description: tight,sore Aggravating factors: walking, transfers  Relieving factors: ice, pain medication   PRECAUTIONS: Fall  RED FLAGS: None   WEIGHT BEARING RESTRICTIONS: Yes WBAT RLE  FALLS:  Has patient fallen in last 6 months? Yes. Number of falls 5; mostly from tripping, most recent fall post-op when she was getting off the toilet  LIVING ENVIRONMENT: Lives with: lives with their spouse Lives in: House/apartment Stairs: Yes: Internal: flight steps; on left going up and External: 3 steps; can reach both Has following equipment at home: Single point cane, Walker - 2 wheeled, and Wheelchair (manual)  OCCUPATION: retired   PLOF: Independent  PATIENT GOALS: "to be able to have ADLs that are normal. Be functionally independent."   NEXT MD VISIT: 01/27/24  OBJECTIVE:  Note: Objective measures were completed at Evaluation unless otherwise noted.  DIAGNOSTIC FINDINGS: no recent imaging on file   PATIENT SURVEYS:  KOOS JR: 36/100 interval; 20/28   01/11/24: KOOS JR: 7/28; 68/100  COGNITION:  Overall cognitive status: Within functional limits for tasks assessed     SENSATION: Not tested  EDEMA:  Bandages donned, though moderate edema present about the Rt knee    POSTURE:  maintains RLE in extension in seated   PALPATION: Not assessed   LOWER EXTREMITY ROM:  Active ROM Right eval Left eval Right 11/30/23 12/21/23 Right  12/28/23 Right  01/11/24 Right   Hip flexion        Hip extension        Hip abduction        Hip adduction        Hip internal rotation        Hip external rotation        Knee flexion 60  A: 89 deg seated 86 95  99  Knee extension Lacking 13   Lacking 1  0 0  Ankle dorsiflexion        Ankle plantarflexion        Ankle inversion        Ankle eversion         (Blank rows = not tested)  LOWER EXTREMITY MMT:  MMT Right eval Left eval 01/11/24 Right   Hip flexion     Hip extension     Hip abduction     Hip adduction     Hip internal rotation     Hip external rotation     Knee flexion   5  Knee extension 3-  4  Ankle dorsiflexion     Ankle plantarflexion     Ankle inversion     Ankle eversion      (Blank rows = not tested)   FUNCTIONAL TESTS:  Sit <> stand CGA Sit <> Supine Min A    GAIT: Distance walked: 5 ft  Assistive device utilized: Environmental consultant - 2 wheeled Level of assistance: CGA Comments: step to pattern, heavy use of UE support [facility] Adult PT Treatment:                                                DATE: 01/18/24 Therapeutic Exercise: NuStep level 5 x 5 minutes UE/LE  Standing hip abduction 2 x 10  Standing hip extension 2 x 10  LAQ 2 x 10 @ 2.5 lbs  Gait training: Without AD 3 laps in clinic: focusing on increasing gait speed, allowing for arm swing, and decreasing trunk lean   Therapeutic Activity: Forward step ups 4 inch single UE support 2 x 10  Forward step ups 6 inch double UE support x 10, x 10 single UE support  Sit to stand 2 x 10     [facility] Adult PT Treatment:                                                DATE: 01/11/24 Therapeutic Exercise: Standing hip abduction  2 x 10  Standing hip extension 2 x 10  Calf raises 2 x 10  NuStep level 5 LE only x 5 minutes   Therapeutic Activity: Re-assessment to determine overall progress, educating patient on progress towards goals.  Sit to stand 2 x 5  Step ups 4 inch x 10 double UE support, x 10 single UE support  Step  taps 2 x 10 6 inch step    [facility] Adult PT Treatment:                                                DATE: 01/04/24 Therapeutic Exercise: NuStep level 5 x 5 minutes UE/LE  Standing HS curl 2 x 10   Standing calf raise 2 x 10  LAQ 2 x 10 @ 1 lb   Therapeutic Activity: Sit to stand from raised height holding ball 2 x 5, x 3  Side stepping at counter 2 sets d/b  Gait training: With SPC/quad cane step through pattern multiple laps in clinic  Focusing on allowing for knee flexion and cane placement                                             PATIENT EDUCATION:  Education details: See impression  Person educated: Patient Education method: Explanation,  Education comprehension: verbalized understanding,   HOME EXERCISE PROGRAM: Access Code: [email or web address] URL: [email or web address] Date: 01/11/2024 Prepared by: Letitia Libra  Exercises - Supine Heel Slide  - 2-3 x daily - 7 x weekly - 1 sets - 10 reps - 5 sec  hold - Seated Knee Flexion AAROM  - 2-3 x daily - 7 x weekly - 1 sets - 10 reps - 5 sec  hold - Seated Long Arc Quad  - 1 x daily - 7 x weekly - 2 sets - 10 reps - Hooklying Clamshell with Resistance  - 1 x daily - 7 x weekly - 2 sets - 10 reps - Sit to Stand Without Arm Support  - 1 x daily - 7 x weekly - 3 sets - 5 reps - Standing Knee Flexion AROM with Chair Support  - 1 x daily - 7 x weekly - 2 sets - 10 reps - Heel Raises with Counter Support  - 1 x daily - 7 x weekly - 2 sets - 10 reps - Standing Hip Abduction with Counter Support  - 1 x daily - 7 x weekly - 2 sets - 10 reps - Standing Hip Extension with Counter Support  - 1 x daily - 7 x weekly - 2 sets - 10 reps  ASSESSMENT:  CLINICAL IMPRESSION: Focused on standing and functional strength progression with good tolerance. With step ups she initially completes with circumduction swing on the RLE, but with cues is able to correct. Completed gait training without AD with patient demonstrating good stability at slow/moderate speed, but does exhibit Rt lateral trunk lean during stance on the RLE with minor ability to correct with cues. Was recommended that she could begin to go without AD at home and continue  use of cane for community ambulation with patient verbalizing understanding.   Per eval - Patient is a 68 y.o. female who was seen today for physical therapy evaluation and treatment for s/p Rt TKA on 11/11/23. She has very limited functional mobility currently requiring assistance with transfers and CGA for short duration ambulation. She demonstrates Rt knee ROM, Rt knee strength, gait and balance deficits. She will benefit from skilled PT to address the above stated deficits in order to return to optimal function, though we did discuss that she reach out to referring provider  as she might be appropriate for home health PT currently given her limited mobility and ability to attend outpatient PT.   OBJECTIVE IMPAIRMENTS: Abnormal gait, decreased activity tolerance, decreased balance, decreased endurance, decreased knowledge of condition, decreased mobility, difficulty walking, decreased ROM, decreased strength, increased edema, improper body mechanics, and pain.   ACTIVITY LIMITATIONS: carrying, lifting, bending, standing, squatting, sleeping, stairs, transfers, bed mobility, bathing, dressing, hygiene/grooming, and locomotion level  PARTICIPATION LIMITATIONS: meal prep, cleaning, laundry, driving, shopping, community activity, and yard work  PERSONAL FACTORS: Age, Fitness, and 3+ comorbidities: see PMH above   are also affecting patient's functional outcome.   REHAB POTENTIAL: Good  CLINICAL DECISION MAKING: Unstable/unpredictable  EVALUATION COMPLEXITY: Moderate   GOALS: Goals reviewed with patient? Yes  SHORT TERM GOALS: Target date: 12/13/2023   Patient will be independent and compliant with initial HEP.   Baseline:  issued at eval  Goal status: MET   2.  Patient will demonstrate at least 90 degrees of Rt knee flexion AROM to improve ability to complete sit to stand transfers.  Baseline: see above Goal status: MET    3.  Patient will improve Rt knee extension AROM by 10 degrees to  improve gait mechanics.  Baseline: see above  Goal status: MET  4.  Patient will be independent with sit <> supine transfers.  Baseline: Min A  12/14/23: independent with sit to supine transfer  Goal status: MET    LONG TERM GOALS: Target date: 02/26/24  Patient will score >/= 55/100 interval score on the KOOS JR (MCID is 14) to signify clinically meaningful improvement in functional abilities.   Baseline: see above  Goal status: MET  2.  Patient will be able to ascend/descend stairs Mod I with handrail use.  Baseline: requires assistance  01/11/24: requires supervision  Goal status: progressing   3.  Patient will be able to ambulate community distances with LRAD.  Baseline: short household distances with RW  01/11/24: reports limited mobility due to lack of endurance (mostly household walking currently)  Goal status: progressing   4.  Patient will demonstrate at least 110 degrees of Rt knee flexion AROM to improve ability to complete sit to stand transfers.  Baseline: see above Goal status: progressing   5.  Patient will report pain at worst rated as </= 3/10 to reduce current functional limitations.  Baseline: 9 01/11/24: 3  Goal status: MET    PLAN:  PT FREQUENCY: 1x/week  PT DURATION: 6 weeks  PLANNED INTERVENTIONS: 97164- PT Re-evaluation, 97110-Therapeutic exercises, 97530- Therapeutic activity, 97112- Neuromuscular re-education, 97535- Self Care, [id number]- Manual therapy, [id number]- Gait training, [id number]- Aquatic Therapy, 97014- Electrical stimulation (unattended), [id number]- Electrical stimulation (manual), 97016- Vasopneumatic device, Dry Needling, Cryotherapy, and Moist heat  PLAN FOR NEXT SESSION: review and progress HEP prn. Rt knee ROM and strengthening; standing strengthening. Try outdoor walking   [place], [place], DPT, ATC 01/18/24 8:42 AM

## 2024-01-25 ENCOUNTER — Ambulatory Visit

## 2024-01-25 DIAGNOSIS — R2689 Other abnormalities of gait and mobility: Secondary | ICD-10-CM

## 2024-01-25 DIAGNOSIS — R600 Localized edema: Secondary | ICD-10-CM

## 2024-01-25 DIAGNOSIS — M6281 Muscle weakness (generalized): Secondary | ICD-10-CM

## 2024-01-25 DIAGNOSIS — M25561 Pain in right knee: Secondary | ICD-10-CM

## 2024-01-25 DIAGNOSIS — M25661 Stiffness of right knee, not elsewhere classified: Secondary | ICD-10-CM

## 2024-01-25 NOTE — Therapy (Signed)
 OUTPATIENT PHYSICAL THERAPY TREATMENT    Patient Name: Terri Nicholson MRN: [id number] DOB:May 26, 1956, 68 y.o., female Today's Date: 01/25/2024  END OF SESSION:  PT End of Session - 01/25/24 0847     Visit Number 9    Number of Visits 13    Date for PT Re-Evaluation 02/26/24    Authorization Type [employer]    PT Start Time 0847    PT Stop Time 0927    PT Time Calculation (min) 40 min    Equipment Utilized During Treatment Gait belt    Activity Tolerance Patient tolerated treatment well                     Past Medical History:  Diagnosis Date   Arthritis    Asthma    Diabetes mellitus without complication (HCC)    Hypertension    IFG (impaired fasting glucose)    Past Surgical History:  Procedure Laterality Date   CARDIAC SURGERY     CESAREAN SECTION     EYE SURGERY     HERNIA REPAIR     Patient Active Problem List   Diagnosis Date Noted   Uncontrolled type 2 diabetes mellitus with hyperglycemia (HCC) 12/14/2018   Complete rotator cuff tear 01/05/2018   Palpitations 03/04/2017   DIABETES MELLITUS, TYPE II 09/19/2007   HYPERTENSION 09/19/2007    PCP: Elyn Han, MD  REFERRING PROVIDER: Dayne Even, MD  REFERRING DIAG: s/p Rt total knee replacement   THERAPY DIAG:  Acute pain of right knee  Stiffness of right knee, not elsewhere classified  Localized edema  Muscle weakness (generalized)  Other abnormalities of gait and mobility  Rationale for Evaluation and Treatment: Rehabilitation  ONSET DATE: 11/11/23  SUBJECTIVE:   Per eval - Patient underwent Rt TKA on 11/11/23. She had a fall on Saturday when trying to transfer off the toilet. She had X-rays taken after the fall and everything looked fine. Patient reports she is walking several times daily with her RW for short distances. She has been completing some of the exercises provided post-op, but they are "rough." Patient reports difficulty with walking, stairs, transfers, and putting  weight on the RLE. She currently needs assistance with sit to stand transfers and it was really difficult getting in/out of the car today.   SUBJECTIVE STATEMENT:  "I think the weather makes me feel it."  PERTINENT HISTORY: Diabetes   PAIN:  Are you having pain? Yes: NPRS scale: 2/10 Pain location: Rt knee Pain description: tight,sore Aggravating factors: walking, transfers  Relieving factors: ice, pain medication   PRECAUTIONS: Fall  RED FLAGS: None   WEIGHT BEARING RESTRICTIONS: Yes WBAT RLE  FALLS:  Has patient fallen in last 6 months? Yes. Number of falls 5; mostly from tripping, most recent fall post-op when she was getting off the toilet  LIVING ENVIRONMENT: Lives with: lives with their spouse Lives in: House/apartment Stairs: Yes: Internal: flight steps; on left going up and External: 3 steps; can reach both Has following equipment at home: Single point cane, Walker - 2 wheeled, and Wheelchair (manual)  OCCUPATION: retired   PLOF: Independent  PATIENT GOALS: "to be able to have ADLs that are normal. Be functionally independent."   NEXT MD VISIT: 01/27/24  OBJECTIVE:  Note: Objective measures were completed at Evaluation unless otherwise noted.  DIAGNOSTIC FINDINGS: no recent imaging on file   PATIENT SURVEYS:  KOOS JR: 36/100 interval; 20/28   01/11/24: KOOS JR: 7/28; 68/100  COGNITION: Overall cognitive status:  Within functional limits for tasks assessed     SENSATION: Not tested  EDEMA:  Bandages donned, though moderate edema present about the Rt knee    POSTURE:  maintains RLE in extension in seated   PALPATION: Not assessed   LOWER EXTREMITY ROM:  Active ROM Right eval Left eval Right 11/30/23 12/21/23 Right  12/28/23 Right  01/11/24 Right   Hip flexion        Hip extension        Hip abduction        Hip adduction        Hip internal rotation        Hip external rotation        Knee flexion 60  A: 89 deg seated 86 95 99  Knee extension  Lacking 13   Lacking 1  0 0  Ankle dorsiflexion        Ankle plantarflexion        Ankle inversion        Ankle eversion         (Blank rows = not tested)  LOWER EXTREMITY MMT:  MMT Right eval Left eval 01/11/24 Right   Hip flexion     Hip extension     Hip abduction     Hip adduction     Hip internal rotation     Hip external rotation     Knee flexion   5  Knee extension 3-  4  Ankle dorsiflexion     Ankle plantarflexion     Ankle inversion     Ankle eversion      (Blank rows = not tested)   FUNCTIONAL TESTS:  Sit <> stand CGA Sit <> Supine Min A    GAIT: Distance walked: 5 ft  Assistive device utilized: Environmental consultant - 2 wheeled Level of assistance: CGA Comments: step to pattern, heavy use of UE support [facility] Adult PT Treatment:                                                DATE: 01/25/24 Therapeutic Exercise: LAQ 2 x 10 @ 2lbs  Standing toe raises 2 x 10  Side stepping 2 x 10 ft d/b Updated HEP   Therapeutic Activity: Curb negotiation multiple trials with HHA and quad cane Outdoor walking on inclined surfaces with quad cane and SBA Sit to stand x 10 holding ball     [facility] Adult PT Treatment:                                                DATE: 01/18/24 Therapeutic Exercise: NuStep level 5 x 5 minutes UE/LE  Standing hip abduction 2 x 10  Standing hip extension 2 x 10  LAQ 2 x 10 @ 2.5 lbs  Gait training: Without AD 3 laps in clinic: focusing on increasing gait speed, allowing for arm swing, and decreasing trunk lean   Therapeutic Activity: Forward step ups 4 inch single UE support 2 x 10  Forward step ups 6 inch double UE support x 10, x 10 single UE support  Sit to stand 2 x 10     [facility] Adult PT Treatment:  DATE: 01/11/24 Therapeutic Exercise: Standing hip abduction  2 x 10  Standing hip extension 2 x 10  Calf raises 2 x 10  NuStep level 5 LE only x 5 minutes   Therapeutic Activity: Re-assessment to  determine overall progress, educating patient on progress towards goals.  Sit to stand 2 x 5  Step ups 4 inch x 10 double UE support, x 10 single UE support  Step taps 2 x 10 6 inch step                                            PATIENT EDUCATION:  Education details: HEP update  Person educated: Patient Education method: Explanation, demo, cues, handout  Education comprehension: verbalized understanding, returned demo, cues   HOME EXERCISE PROGRAM: Access Code: [email or web address] URL: [email or web address] Date: 01/25/2024 Prepared by: Forrestine Ike  Exercises - Supine Heel Slide  - 2-3 x daily - 7 x weekly - 1 sets - 10 reps - 5 sec  hold - Seated Knee Flexion AAROM  - 2-3 x daily - 7 x weekly - 1 sets - 10 reps - 5 sec  hold - Seated Long Arc Quad  - 1 x daily - 7 x weekly - 2 sets - 10 reps - Hooklying Clamshell with Resistance  - 1 x daily - 7 x weekly - 2 sets - 10 reps - Sit to Stand Without Arm Support  - 1 x daily - 7 x weekly - 3 sets - 5 reps - Standing Knee Flexion AROM with Chair Support  - 1 x daily - 7 x weekly - 2 sets - 10 reps - Heel Raises with Counter Support  - 1 x daily - 7 x weekly - 2 sets - 10 reps - Standing Hip Abduction with Counter Support  - 1 x daily - 7 x weekly - 2 sets - 10 reps - Standing Hip Extension with Counter Support  - 1 x daily - 7 x weekly - 2 sets - 10 reps - Toe Raises with Counter Support  - 1 x daily - 7 x weekly - 2 sets - 10 reps  ASSESSMENT:  CLINICAL IMPRESSION: Heavy emphasis on outdoor walking and curb negotiation to work towards independent community ambulation. With curb negotiation she requires use of HHA and quad cane. She does appear to have more strength in the RLE compared to LLE as she requires less support from PT when RLE completes ascent/descent. With outdoor walking on inclined/uneven surfaces she has difficulty/hesitancy in gait when she approaches inclined curb, but is able to maintain stability at slower gait  speed.   Per eval - Patient is a 68 y.o. female who was seen today for physical therapy evaluation and treatment for s/p Rt TKA on 11/11/23. She has very limited functional mobility currently requiring assistance with transfers and CGA for short duration ambulation. She demonstrates Rt knee ROM, Rt knee strength, gait and balance deficits. She will benefit from skilled PT to address the above stated deficits in order to return to optimal function, though we did discuss that she reach out to referring provider as she might be appropriate for home health PT currently given her limited mobility and ability to attend outpatient PT.   OBJECTIVE IMPAIRMENTS: Abnormal gait, decreased activity tolerance, decreased balance, decreased endurance, decreased knowledge of condition, decreased mobility, difficulty walking, decreased ROM, decreased strength, increased edema,  improper body mechanics, and pain.   ACTIVITY LIMITATIONS: carrying, lifting, bending, standing, squatting, sleeping, stairs, transfers, bed mobility, bathing, dressing, hygiene/grooming, and locomotion level  PARTICIPATION LIMITATIONS: meal prep, cleaning, laundry, driving, shopping, community activity, and yard work  PERSONAL FACTORS: Age, Fitness, and 3+ comorbidities: see PMH above   are also affecting patient's functional outcome.   REHAB POTENTIAL: Good  CLINICAL DECISION MAKING: Unstable/unpredictable  EVALUATION COMPLEXITY: Moderate   GOALS: Goals reviewed with patient? Yes  SHORT TERM GOALS: Target date: 12/13/2023   Patient will be independent and compliant with initial HEP.   Baseline:  issued at eval  Goal status: MET   2.  Patient will demonstrate at least 90 degrees of Rt knee flexion AROM to improve ability to complete sit to stand transfers.  Baseline: see above Goal status: MET    3.  Patient will improve Rt knee extension AROM by 10 degrees to improve gait mechanics.  Baseline: see above  Goal status: MET  4.   Patient will be independent with sit <> supine transfers.  Baseline: Min A  12/14/23: independent with sit to supine transfer  Goal status: MET    LONG TERM GOALS: Target date: 02/26/24  Patient will score >/= 55/100 interval score on the KOOS JR (MCID is 14) to signify clinically meaningful improvement in functional abilities.   Baseline: see above  Goal status: MET  2.  Patient will be able to ascend/descend stairs Mod I with handrail use.  Baseline: requires assistance  01/11/24: requires supervision  Goal status: progressing   3.  Patient will be able to ambulate community distances with LRAD.  Baseline: short household distances with RW  01/11/24: reports limited mobility due to lack of endurance (mostly household walking currently)  Goal status: progressing   4.  Patient will demonstrate at least 110 degrees of Rt knee flexion AROM to improve ability to complete sit to stand transfers.  Baseline: see above Goal status: progressing   5.  Patient will report pain at worst rated as </= 3/10 to reduce current functional limitations.  Baseline: 9 01/11/24: 3  Goal status: MET    PLAN:  PT FREQUENCY: 1x/week  PT DURATION: 6 weeks  PLANNED INTERVENTIONS: 97164- PT Re-evaluation, 97110-Therapeutic exercises, 97530- Therapeutic activity, 97112- Neuromuscular re-education, 97535- Self Care, 93235- Manual therapy, [id number]- Gait training, [id number]- Aquatic Therapy, 97014- Electrical stimulation (unattended), [id number]- Electrical stimulation (manual), 97016- Vasopneumatic device, Dry Needling, Cryotherapy, and Moist heat  PLAN FOR NEXT SESSION: review and progress HEP prn. Rt knee ROM and strengthening; standing strengthening.outdoor walking   [place], [place], DPT, ATC 01/25/24 9:28 AM

## 2024-02-01 ENCOUNTER — Ambulatory Visit

## 2024-02-01 DIAGNOSIS — M25661 Stiffness of right knee, not elsewhere classified: Secondary | ICD-10-CM

## 2024-02-01 DIAGNOSIS — R600 Localized edema: Secondary | ICD-10-CM

## 2024-02-01 DIAGNOSIS — M6281 Muscle weakness (generalized): Secondary | ICD-10-CM

## 2024-02-01 DIAGNOSIS — M25561 Pain in right knee: Secondary | ICD-10-CM

## 2024-02-01 DIAGNOSIS — R2689 Other abnormalities of gait and mobility: Secondary | ICD-10-CM

## 2024-02-01 NOTE — Therapy (Signed)
 OUTPATIENT PHYSICAL THERAPY TREATMENT    Patient Name: Terri Nicholson MRN: [id number] DOB:Aug 21, 1956, 68 y.o., female Today's Date: 02/01/2024  END OF SESSION:  PT End of Session - 02/01/24 0801     Visit Number 10    Number of Visits 13    Date for PT Re-Evaluation 02/26/24    Authorization Type [employer]    PT Start Time 0800    PT Stop Time 0843    PT Time Calculation (min) 43 min    Equipment Utilized During Treatment Gait belt    Activity Tolerance Patient tolerated treatment well                      Past Medical History:  Diagnosis Date   Arthritis    Asthma    Diabetes mellitus without complication (HCC)    Hypertension    IFG (impaired fasting glucose)    Past Surgical History:  Procedure Laterality Date   CARDIAC SURGERY     CESAREAN SECTION     EYE SURGERY     HERNIA REPAIR     Patient Active Problem List   Diagnosis Date Noted   Uncontrolled type 2 diabetes mellitus with hyperglycemia (HCC) 12/14/2018   Complete rotator cuff tear 01/05/2018   Palpitations 03/04/2017   DIABETES MELLITUS, TYPE II 09/19/2007   HYPERTENSION 09/19/2007    PCP: Elyn Han, MD  REFERRING PROVIDER: Dayne Even, MD  REFERRING DIAG: s/p Rt total knee replacement   THERAPY DIAG:  Acute pain of right knee  Stiffness of right knee, not elsewhere classified  Localized edema  Muscle weakness (generalized)  Other abnormalities of gait and mobility  Rationale for Evaluation and Treatment: Rehabilitation  ONSET DATE: 11/11/23  SUBJECTIVE:   Per eval - Patient underwent Rt TKA on 11/11/23. She had a fall on Saturday when trying to transfer off the toilet. She had X-rays taken after the fall and everything looked fine. Patient reports she is walking several times daily with her RW for short distances. She has been completing some of the exercises provided post-op, but they are "rough." Patient reports difficulty with walking, stairs, transfers, and  putting weight on the RLE. She currently needs assistance with sit to stand transfers and it was really difficult getting in/out of the car today.   SUBJECTIVE STATEMENT:  "I still feel it sometimes."  PERTINENT HISTORY: Diabetes   PAIN:  Are you having pain? Yes: NPRS scale: 4/10 Pain location: Rt knee Pain description: tight,sore Aggravating factors: walking, transfers  Relieving factors: ice, pain medication   PRECAUTIONS: Fall  RED FLAGS: None   WEIGHT BEARING RESTRICTIONS: Yes WBAT RLE  FALLS:  Has patient fallen in last 6 months? Yes. Number of falls 5; mostly from tripping, most recent fall post-op when she was getting off the toilet  LIVING ENVIRONMENT: Lives with: lives with their spouse Lives in: House/apartment Stairs: Yes: Internal: flight steps; on left going up and External: 3 steps; can reach both Has following equipment at home: Single point cane, Walker - 2 wheeled, and Wheelchair (manual)  OCCUPATION: retired   PLOF: Independent  PATIENT GOALS: "to be able to have ADLs that are normal. Be functionally independent."   NEXT MD VISIT: 01/27/24  OBJECTIVE:  Note: Objective measures were completed at Evaluation unless otherwise noted.  DIAGNOSTIC FINDINGS: no recent imaging on file   PATIENT SURVEYS:  KOOS JR: 36/100 interval; 20/28   01/11/24: KOOS JR: 7/28; 68/100  COGNITION: Overall cognitive status: Within functional  limits for tasks assessed     SENSATION: Not tested  EDEMA:  Bandages donned, though moderate edema present about the Rt knee    POSTURE:  maintains RLE in extension in seated   PALPATION: Not assessed   LOWER EXTREMITY ROM:  Active ROM Right eval Left eval Right 11/30/23 12/21/23 Right  12/28/23 Right  01/11/24 Right   Hip flexion        Hip extension        Hip abduction        Hip adduction        Hip internal rotation        Hip external rotation        Knee flexion 60  A: 89 deg seated 86 95 99  Knee extension  Lacking 13   Lacking 1  0 0  Ankle dorsiflexion        Ankle plantarflexion        Ankle inversion        Ankle eversion         (Blank rows = not tested)  LOWER EXTREMITY MMT:  MMT Right eval Left eval 01/11/24 Right   Hip flexion     Hip extension     Hip abduction     Hip adduction     Hip internal rotation     Hip external rotation     Knee flexion   5  Knee extension 3-  4  Ankle dorsiflexion     Ankle plantarflexion     Ankle inversion     Ankle eversion      (Blank rows = not tested)   FUNCTIONAL TESTS:  Sit <> stand CGA Sit <> Supine Min A    GAIT: Distance walked: 5 ft  Assistive device utilized: Environmental consultant - 2 wheeled Level of assistance: CGA Comments: step to pattern, heavy use of UE support  [facility] Adult PT Treatment:                                                DATE: 02/01/24  Neuromuscular re-ed: SLR 2 x 10  Hip bridge 2 x 10  Resisted hip abduction blue band 2 x 10  Therapeutic Activity: Curb negotiation multiple trials with quad cane Outdoor walking on inclined surfaces with quad cane, increased inclined compared to previous session; SBA  Leg press 2 x 10 @ 40 lbs    [facility] Adult PT Treatment:                                                DATE: 01/25/24 Therapeutic Exercise: LAQ 2 x 10 @ 2lbs  Standing toe raises 2 x 10  Side stepping 2 x 10 ft d/b Updated HEP   Therapeutic Activity: Curb negotiation multiple trials with HHA and quad cane Outdoor walking on inclined surfaces with quad cane and SBA Sit to stand x 10 holding ball     [facility] Adult PT Treatment:  DATE: 01/18/24 Therapeutic Exercise: NuStep level 5 x 5 minutes UE/LE  Standing hip abduction 2 x 10  Standing hip extension 2 x 10  LAQ 2 x 10 @ 2.5 lbs  Gait training: Without AD 3 laps in clinic: focusing on increasing gait speed, allowing for arm swing, and decreasing trunk lean   Therapeutic Activity: Forward step ups 4 inch single  UE support 2 x 10  Forward step ups 6 inch double UE support x 10, x 10 single UE support  Sit to stand 2 x 10     [facility] Adult PT Treatment:                                                DATE: 01/11/24 Therapeutic Exercise: Standing hip abduction  2 x 10  Standing hip extension 2 x 10  Calf raises 2 x 10  NuStep level 5 LE only x 5 minutes   Therapeutic Activity: Re-assessment to determine overall progress, educating patient on progress towards goals.  Sit to stand 2 x 5  Step ups 4 inch x 10 double UE support, x 10 single UE support  Step taps 2 x 10 6 inch step                                            PATIENT EDUCATION:  Education details: HEP review  Person educated: Patient Education method: Programmer, multimedia,  Education comprehension: verbalized understanding,   HOME EXERCISE PROGRAM: Access Code: [email or web address] URL: [email or web address] Date: 01/25/2024 Prepared by: Forrestine Ike  Exercises - Supine Heel Slide  - 2-3 x daily - 7 x weekly - 1 sets - 10 reps - 5 sec  hold - Seated Knee Flexion AAROM  - 2-3 x daily - 7 x weekly - 1 sets - 10 reps - 5 sec  hold - Seated Long Arc Quad  - 1 x daily - 7 x weekly - 2 sets - 10 reps - Hooklying Clamshell with Resistance  - 1 x daily - 7 x weekly - 2 sets - 10 reps - Sit to Stand Without Arm Support  - 1 x daily - 7 x weekly - 3 sets - 5 reps - Standing Knee Flexion AROM with Chair Support  - 1 x daily - 7 x weekly - 2 sets - 10 reps - Heel Raises with Counter Support  - 1 x daily - 7 x weekly - 2 sets - 10 reps - Standing Hip Abduction with Counter Support  - 1 x daily - 7 x weekly - 2 sets - 10 reps - Standing Hip Extension with Counter Support  - 1 x daily - 7 x weekly - 2 sets - 10 reps - Toe Raises with Counter Support  - 1 x daily - 7 x weekly - 2 sets - 10 reps  ASSESSMENT:  CLINICAL IMPRESSION: Continued with outdoor walking and curb negotiation to work towards independent community ambulation. She is able to  complete curb negotiation independently with the RLE as lead leg and quad cane. With curb descent she does require side-stepping maneuver to complete independently. She demonstrates smoother transitions with change in incline today compared to previous visit. With inclined (hill) walking she has difficulty controlling descent as she speeds  up she has difficulty maintaining quad cane sequencing. Good control and stability with hill ascent. As she fatigues with outdoor walking, lateral trunk lean becomes present so focused on gluteal strengthening. She was encouraged to continue with daily walking program outdoors using quad cane with patient verbalizing understanding.   Per eval - Patient is a 68 y.o. female who was seen today for physical therapy evaluation and treatment for s/p Rt TKA on 11/11/23. She has very limited functional mobility currently requiring assistance with transfers and CGA for short duration ambulation. She demonstrates Rt knee ROM, Rt knee strength, gait and balance deficits. She will benefit from skilled PT to address the above stated deficits in order to return to optimal function, though we did discuss that she reach out to referring provider as she might be appropriate for home health PT currently given her limited mobility and ability to attend outpatient PT.   OBJECTIVE IMPAIRMENTS: Abnormal gait, decreased activity tolerance, decreased balance, decreased endurance, decreased knowledge of condition, decreased mobility, difficulty walking, decreased ROM, decreased strength, increased edema, improper body mechanics, and pain.   ACTIVITY LIMITATIONS: carrying, lifting, bending, standing, squatting, sleeping, stairs, transfers, bed mobility, bathing, dressing, hygiene/grooming, and locomotion level  PARTICIPATION LIMITATIONS: meal prep, cleaning, laundry, driving, shopping, community activity, and yard work  PERSONAL FACTORS: Age, Fitness, and 3+ comorbidities: see PMH above   are also  affecting patient's functional outcome.   REHAB POTENTIAL: Good  CLINICAL DECISION MAKING: Unstable/unpredictable  EVALUATION COMPLEXITY: Moderate   GOALS: Goals reviewed with patient? Yes  SHORT TERM GOALS: Target date: 12/13/2023   Patient will be independent and compliant with initial HEP.   Baseline:  issued at eval  Goal status: MET   2.  Patient will demonstrate at least 90 degrees of Rt knee flexion AROM to improve ability to complete sit to stand transfers.  Baseline: see above Goal status: MET    3.  Patient will improve Rt knee extension AROM by 10 degrees to improve gait mechanics.  Baseline: see above  Goal status: MET  4.  Patient will be independent with sit <> supine transfers.  Baseline: Min A  12/14/23: independent with sit to supine transfer  Goal status: MET    LONG TERM GOALS: Target date: 02/26/24  Patient will score >/= 55/100 interval score on the KOOS JR (MCID is 14) to signify clinically meaningful improvement in functional abilities.   Baseline: see above  Goal status: MET  2.  Patient will be able to ascend/descend stairs Mod I with handrail use.  Baseline: requires assistance  01/11/24: requires supervision  Goal status: progressing   3.  Patient will be able to ambulate community distances with LRAD.  Baseline: short household distances with RW  01/11/24: reports limited mobility due to lack of endurance (mostly household walking currently)  Goal status: progressing   4.  Patient will demonstrate at least 110 degrees of Rt knee flexion AROM to improve ability to complete sit to stand transfers.  Baseline: see above Goal status: progressing   5.  Patient will report pain at worst rated as </= 3/10 to reduce current functional limitations.  Baseline: 9 01/11/24: 3  Goal status: MET    PLAN:  PT FREQUENCY: 1x/week  PT DURATION: 6 weeks  PLANNED INTERVENTIONS: 97164- PT Re-evaluation, 97110-Therapeutic exercises, 97530- Therapeutic  activity, 97112- Neuromuscular re-education, 97535- Self Care, [id number]- Manual therapy, [id number]- Gait training, [id number]- Aquatic Therapy, 97014- Electrical stimulation (unattended), [id number]- Electrical stimulation (manual), 97016- Vasopneumatic device, Dry Needling, Cryotherapy, and  Moist heat  PLAN FOR NEXT SESSION: review and progress HEP prn. Rt knee ROM and strengthening; standing strengthening.outdoor walking (incline); glute strengthening   Forrestine Ike, PT, DPT, ATC 02/01/24 8:43 AM

## 2024-02-08 ENCOUNTER — Ambulatory Visit

## 2024-02-08 DIAGNOSIS — M6281 Muscle weakness (generalized): Secondary | ICD-10-CM

## 2024-02-08 DIAGNOSIS — M25561 Pain in right knee: Secondary | ICD-10-CM

## 2024-02-08 DIAGNOSIS — R600 Localized edema: Secondary | ICD-10-CM

## 2024-02-08 DIAGNOSIS — R2689 Other abnormalities of gait and mobility: Secondary | ICD-10-CM

## 2024-02-08 DIAGNOSIS — M25661 Stiffness of right knee, not elsewhere classified: Secondary | ICD-10-CM

## 2024-02-08 NOTE — Therapy (Signed)
 OUTPATIENT PHYSICAL THERAPY TREATMENT    Patient Name: Terri Nicholson MRN: [id number] DOB:[birth year], 68 y.o., female Today's Date: 02/08/2024  END OF SESSION:  PT End of Session - 02/08/24 0759     Visit Number 11    Number of Visits 13    Date for PT Re-Evaluation 02/26/24    Authorization Type [employer]    PT Start Time 0800    PT Stop Time 0844    PT Time Calculation (min) 44 min    Equipment Utilized During Treatment Gait belt    Activity Tolerance Patient tolerated treatment well                       Past Medical History:  Diagnosis Date   Arthritis    Asthma    Diabetes mellitus without complication (HCC)    Hypertension    IFG (impaired fasting glucose)    Past Surgical History:  Procedure Laterality Date   CARDIAC SURGERY     CESAREAN SECTION     EYE SURGERY     HERNIA REPAIR     Patient Active Problem List   Diagnosis Date Noted   Uncontrolled type 2 diabetes mellitus with hyperglycemia (HCC) 12/14/2018   Complete rotator cuff tear 01/05/2018   Palpitations 03/04/2017   DIABETES MELLITUS, TYPE II 09/19/2007   HYPERTENSION 09/19/2007    PCP: Elyn Han, MD  REFERRING PROVIDER: Dayne Even, MD  REFERRING DIAG: s/p Rt total knee replacement   THERAPY DIAG:  Acute pain of right knee  Stiffness of right knee, not elsewhere classified  Localized edema  Muscle weakness (generalized)  Other abnormalities of gait and mobility  Rationale for Evaluation and Treatment: Rehabilitation  ONSET DATE: 11/11/23  SUBJECTIVE:   Per eval - Patient underwent Rt TKA on 11/11/23. She had a fall on Saturday when trying to transfer off the toilet. She had X-rays taken after the fall and everything looked fine. Patient reports she is walking several times daily with her RW for short distances. She has been completing some of the exercises provided post-op, but they are "rough." Patient reports difficulty with walking, stairs, transfers, and  putting weight on the RLE. She currently needs assistance with sit to stand transfers and it was really difficult getting in/out of the car today.   SUBJECTIVE STATEMENT:  "I think I feel it more because I am doing more." She was able to go grocery shopping since last visit.   PERTINENT HISTORY: Diabetes   PAIN:  Are you having pain? Yes: NPRS scale: 4/10 Pain location: Rt knee Pain description: tight,sore Aggravating factors: walking, transfers  Relieving factors: ice, pain medication   PRECAUTIONS: Fall  RED FLAGS: None   WEIGHT BEARING RESTRICTIONS: Yes WBAT RLE  FALLS:  Has patient fallen in last 6 months? Yes. Number of falls 5; mostly from tripping, most recent fall post-op when she was getting off the toilet  LIVING ENVIRONMENT: Lives with: lives with their spouse Lives in: House/apartment Stairs: Yes: Internal: flight steps; on left going up and External: 3 steps; can reach both Has following equipment at home: Single point cane, Walker - 2 wheeled, and Wheelchair (manual)  OCCUPATION: retired   PLOF: Independent  PATIENT GOALS: "to be able to have ADLs that are normal. Be functionally independent."   NEXT MD VISIT: 01/27/24  OBJECTIVE:  Note: Objective measures were completed at Evaluation unless otherwise noted.  DIAGNOSTIC FINDINGS: no recent imaging on file   PATIENT SURVEYS:  KOOS  JR: 36/100 interval; 20/28   01/11/24: KOOS JR: 7/28; 68/100  COGNITION: Overall cognitive status: Within functional limits for tasks assessed     SENSATION: Not tested  EDEMA:  Bandages donned, though moderate edema present about the Rt knee    POSTURE:  maintains RLE in extension in seated   PALPATION: Not assessed   LOWER EXTREMITY ROM:  Active ROM Right eval Left eval Right 11/30/23 12/21/23 Right  12/28/23 Right  01/11/24 Right  02/08/24 Right   Hip flexion         Hip extension         Hip abduction         Hip adduction         Hip internal rotation          Hip external rotation         Knee flexion 60  A: 89 deg seated 86 95 99 106  Knee extension Lacking 13   Lacking 1  0 0   Ankle dorsiflexion         Ankle plantarflexion         Ankle inversion         Ankle eversion          (Blank rows = not tested)  LOWER EXTREMITY MMT:  MMT Right eval Left eval 01/11/24 Right   Hip flexion     Hip extension     Hip abduction     Hip adduction     Hip internal rotation     Hip external rotation     Knee flexion   5  Knee extension 3-  4  Ankle dorsiflexion     Ankle plantarflexion     Ankle inversion     Ankle eversion      (Blank rows = not tested)   FUNCTIONAL TESTS:  Sit <> stand CGA Sit <> Supine Min A    GAIT: Distance walked: 5 ft  Assistive device utilized: Environmental consultant - 2 wheeled Level of assistance: CGA Comments: step to pattern, heavy use of UE support [facility] Adult PT Treatment:                                                DATE: 02/08/24  Neuromuscular re-ed: Lateral step ups on airex x 10 each Calf raise on airex 2  x10  Forward step ups on airex x 10 each LAQ 2 x 10, 2.5 lbs  Therapeutic Activity: Outdoor walking on inclined surface with quad cane, (hill walking); SBA Curb negotiation with quad cane SBA Sit to stand 2 x 10 @ 5 lbs    [facility] Adult PT Treatment:                                                DATE: 02/01/24  Neuromuscular re-ed: SLR 2 x 10  Hip bridge 2 x 10  Resisted hip abduction blue band 2 x 10  Therapeutic Activity: Curb negotiation multiple trials with quad cane Outdoor walking on inclined surfaces with quad cane, increased inclined compared to previous session; SBA  Leg press 2 x 10 @ 40 lbs    [facility] Adult PT Treatment:  DATE: 01/25/24 Therapeutic Exercise: LAQ 2 x 10 @ 2lbs  Standing toe raises 2 x 10  Side stepping 2 x 10 ft d/b Updated HEP   Therapeutic Activity: Curb negotiation multiple trials with HHA and quad cane Outdoor walking  on inclined surfaces with quad cane and SBA Sit to stand x 10 holding ball     [facility] Adult PT Treatment:                                                DATE: 01/18/24 Therapeutic Exercise: NuStep level 5 x 5 minutes UE/LE  Standing hip abduction 2 x 10  Standing hip extension 2 x 10  LAQ 2 x 10 @ 2.5 lbs  Gait training: Without AD 3 laps in clinic: focusing on increasing gait speed, allowing for arm swing, and decreasing trunk lean   Therapeutic Activity: Forward step ups 4 inch single UE support 2 x 10  Forward step ups 6 inch double UE support x 10, x 10 single UE support  Sit to stand 2 x 10                                             PATIENT EDUCATION:  Education details: HEP review  Person educated: Patient Education method: Programmer, multimedia,  Education comprehension: verbalized understanding,   HOME EXERCISE PROGRAM: Access Code: [email or web address] URL: [email or web address] Date: 01/25/2024 Prepared by: Forrestine Ike  Exercises - Supine Heel Slide  - 2-3 x daily - 7 x weekly - 1 sets - 10 reps - 5 sec  hold - Seated Knee Flexion AAROM  - 2-3 x daily - 7 x weekly - 1 sets - 10 reps - 5 sec  hold - Seated Long Arc Quad  - 1 x daily - 7 x weekly - 2 sets - 10 reps - Hooklying Clamshell with Resistance  - 1 x daily - 7 x weekly - 2 sets - 10 reps - Sit to Stand Without Arm Support  - 1 x daily - 7 x weekly - 3 sets - 5 reps - Standing Knee Flexion AROM with Chair Support  - 1 x daily - 7 x weekly - 2 sets - 10 reps - Heel Raises with Counter Support  - 1 x daily - 7 x weekly - 2 sets - 10 reps - Standing Hip Abduction with Counter Support  - 1 x daily - 7 x weekly - 2 sets - 10 reps - Standing Hip Extension with Counter Support  - 1 x daily - 7 x weekly - 2 sets - 10 reps - Toe Raises with Counter Support  - 1 x daily - 7 x weekly - 2 sets - 10 reps  ASSESSMENT:  CLINICAL IMPRESSION: Continued with outdoor walking and curb negotiation with good tolerance. As she  fatigues without outdoor walking she does demonstrates less lumbopelvic stability, but is able to maintain balance independently. With hill descent she has better control and is able to slow her gait speed and control descent with use of quad cane compared to previous visit. Added in standing activity on unstable surface as patient would like to improve balance on unstable surfaces (sand) with patient demonstrating good stability with use of UE support. Knee flexion AROM  has improved compared to previous assessment.   Per eval - Patient is a 68 y.o. female who was seen today for physical therapy evaluation and treatment for s/p Rt TKA on 11/11/23. She has very limited functional mobility currently requiring assistance with transfers and CGA for short duration ambulation. She demonstrates Rt knee ROM, Rt knee strength, gait and balance deficits. She will benefit from skilled PT to address the above stated deficits in order to return to optimal function, though we did discuss that she reach out to referring provider as she might be appropriate for home health PT currently given her limited mobility and ability to attend outpatient PT.   OBJECTIVE IMPAIRMENTS: Abnormal gait, decreased activity tolerance, decreased balance, decreased endurance, decreased knowledge of condition, decreased mobility, difficulty walking, decreased ROM, decreased strength, increased edema, improper body mechanics, and pain.   ACTIVITY LIMITATIONS: carrying, lifting, bending, standing, squatting, sleeping, stairs, transfers, bed mobility, bathing, dressing, hygiene/grooming, and locomotion level  PARTICIPATION LIMITATIONS: meal prep, cleaning, laundry, driving, shopping, community activity, and yard work  PERSONAL FACTORS: Age, Fitness, and 3+ comorbidities: see PMH above   are also affecting patient's functional outcome.   REHAB POTENTIAL: Good  CLINICAL DECISION MAKING: Unstable/unpredictable  EVALUATION COMPLEXITY:  Moderate   GOALS: Goals reviewed with patient? Yes  SHORT TERM GOALS: Target date: 12/13/2023   Patient will be independent and compliant with initial HEP.   Baseline:  issued at eval  Goal status: MET   2.  Patient will demonstrate at least 90 degrees of Rt knee flexion AROM to improve ability to complete sit to stand transfers.  Baseline: see above Goal status: MET    3.  Patient will improve Rt knee extension AROM by 10 degrees to improve gait mechanics.  Baseline: see above  Goal status: MET  4.  Patient will be independent with sit <> supine transfers.  Baseline: Min A  12/14/23: independent with sit to supine transfer  Goal status: MET    LONG TERM GOALS: Target date: 02/26/24  Patient will score >/= 55/100 interval score on the KOOS JR (MCID is 14) to signify clinically meaningful improvement in functional abilities.   Baseline: see above  Goal status: MET  2.  Patient will be able to ascend/descend stairs Mod I with handrail use.  Baseline: requires assistance  01/11/24: requires supervision  Goal status: progressing   3.  Patient will be able to ambulate community distances with LRAD.  Baseline: short household distances with RW  01/11/24: reports limited mobility due to lack of endurance (mostly household walking currently)  Goal status: progressing   4.  Patient will demonstrate at least 110 degrees of Rt knee flexion AROM to improve ability to complete sit to stand transfers.  Baseline: see above Goal status: progressing   5.  Patient will report pain at worst rated as </= 3/10 to reduce current functional limitations.  Baseline: 9 01/11/24: 3  Goal status: MET    PLAN:  PT FREQUENCY: 1x/week  PT DURATION: 6 weeks  PLANNED INTERVENTIONS: 97164- PT Re-evaluation, 97110-Therapeutic exercises, 97530- Therapeutic activity, 97112- Neuromuscular re-education, 97535- Self Care, [id number]- Manual therapy, [id number]- Gait training, [id number]- Aquatic Therapy, 97014-  Electrical stimulation (unattended), [id number]- Electrical stimulation (manual), 97016- Vasopneumatic device, Dry Needling, Cryotherapy, and Moist heat  PLAN FOR NEXT SESSION: review and progress HEP prn. Rt knee ROM and strengthening; standing strengthening.outdoor walking, balance activity.   Zoe Nordin, PT, DPT, ATC 02/08/24 8:46 AM

## 2024-02-15 ENCOUNTER — Ambulatory Visit: Attending: Orthopaedic Surgery

## 2024-02-15 DIAGNOSIS — M25661 Stiffness of right knee, not elsewhere classified: Secondary | ICD-10-CM | POA: Diagnosis present

## 2024-02-15 DIAGNOSIS — R2689 Other abnormalities of gait and mobility: Secondary | ICD-10-CM | POA: Diagnosis present

## 2024-02-15 DIAGNOSIS — R600 Localized edema: Secondary | ICD-10-CM | POA: Diagnosis present

## 2024-02-15 DIAGNOSIS — M25561 Pain in right knee: Secondary | ICD-10-CM | POA: Insufficient documentation

## 2024-02-15 DIAGNOSIS — M6281 Muscle weakness (generalized): Secondary | ICD-10-CM | POA: Diagnosis present

## 2024-02-15 NOTE — Therapy (Signed)
 OUTPATIENT PHYSICAL THERAPY TREATMENT    Patient Name: Terri Nicholson MRN: [id number] DOB:Dec 09, 1955, 68 y.o., female Today's Date: 02/15/2024  END OF SESSION:  PT End of Session - 02/15/24 0858     Visit Number 12    Number of Visits 13    Date for PT Re-Evaluation 02/26/24    Authorization Type [employer]    PT Start Time [id number]    PT Stop Time 0927    PT Time Calculation (min) 41 min    Equipment Utilized During Treatment Gait belt    Activity Tolerance Patient tolerated treatment well                        Past Medical History:  Diagnosis Date   Arthritis    Asthma    Diabetes mellitus without complication (HCC)    Hypertension    IFG (impaired fasting glucose)    Past Surgical History:  Procedure Laterality Date   CARDIAC SURGERY     CESAREAN SECTION     EYE SURGERY     HERNIA REPAIR     Patient Active Problem List   Diagnosis Date Noted   Uncontrolled type 2 diabetes mellitus with hyperglycemia (HCC) 12/14/2018   Complete rotator cuff tear 01/05/2018   Palpitations 03/04/2017   DIABETES MELLITUS, TYPE II 09/19/2007   HYPERTENSION 09/19/2007    PCP: Elyn Han, MD  REFERRING PROVIDER: Dayne Even, MD  REFERRING DIAG: s/p Rt total knee replacement   THERAPY DIAG:  Acute pain of right knee  Stiffness of right knee, not elsewhere classified  Localized edema  Muscle weakness (generalized)  Other abnormalities of gait and mobility  Rationale for Evaluation and Treatment: Rehabilitation  ONSET DATE: 11/11/23  SUBJECTIVE:   Per eval - Patient underwent Rt TKA on 11/11/23. She had a fall on Saturday when trying to transfer off the toilet. She had X-rays taken after the fall and everything looked fine. Patient reports she is walking several times daily with her RW for short distances. She has been completing some of the exercises provided post-op, but they are "rough." Patient reports difficulty with walking, stairs, transfers, and  putting weight on the RLE. She currently needs assistance with sit to stand transfers and it was really difficult getting in/out of the car today.   SUBJECTIVE STATEMENT:  Patient reports she feels a little wobbly today. Knee pain improves with walking and exercises.   PERTINENT HISTORY: Diabetes   PAIN:  Are you having pain? Yes: NPRS scale: 1/10 Pain location: Rt knee Pain description: tight,sore Aggravating factors: walking, transfers  Relieving factors: ice, pain medication   PRECAUTIONS: Fall  RED FLAGS: None   WEIGHT BEARING RESTRICTIONS: Yes WBAT RLE  FALLS:  Has patient fallen in last 6 months? Yes. Number of falls 5; mostly from tripping, most recent fall post-op when she was getting off the toilet  LIVING ENVIRONMENT: Lives with: lives with their spouse Lives in: House/apartment Stairs: Yes: Internal: flight steps; on left going up and External: 3 steps; can reach both Has following equipment at home: Single point cane, Walker - 2 wheeled, and Wheelchair (manual)  OCCUPATION: retired   PLOF: Independent  PATIENT GOALS: "to be able to have ADLs that are normal. Be functionally independent."   NEXT MD VISIT: 01/27/24  OBJECTIVE:  Note: Objective measures were completed at Evaluation unless otherwise noted.  DIAGNOSTIC FINDINGS: no recent imaging on file   PATIENT SURVEYS:  KOOS JR: 36/100 interval; 20/28  01/11/24: KOOS JR: 7/28; 68/100  COGNITION: Overall cognitive status: Within functional limits for tasks assessed     SENSATION: Not tested  EDEMA:  Bandages donned, though moderate edema present about the Rt knee    POSTURE:  maintains RLE in extension in seated   PALPATION: Not assessed   LOWER EXTREMITY ROM:  Active ROM Right eval Left eval Right 11/30/23 12/21/23 Right  12/28/23 Right  01/11/24 Right  02/08/24 Right   Hip flexion         Hip extension         Hip abduction         Hip adduction         Hip internal rotation         Hip  external rotation         Knee flexion 60  A: 89 deg seated 86 95 99 106  Knee extension Lacking 13   Lacking 1  0 0   Ankle dorsiflexion         Ankle plantarflexion         Ankle inversion         Ankle eversion          (Blank rows = not tested)  LOWER EXTREMITY MMT:  MMT Right eval Left eval 01/11/24 Right   Hip flexion     Hip extension     Hip abduction     Hip adduction     Hip internal rotation     Hip external rotation     Knee flexion   5  Knee extension 3-  4  Ankle dorsiflexion     Ankle plantarflexion     Ankle inversion     Ankle eversion      (Blank rows = not tested)   FUNCTIONAL TESTS:  Sit <> stand CGA Sit <> Supine Min A    GAIT: Distance walked: 5 ft  Assistive device utilized: Environmental consultant - 2 wheeled Level of assistance: CGA Comments: step to pattern, heavy use of UE support [facility] Adult PT Treatment:                                                DATE: 02/15/24  Neuromuscular re-ed: LAQ 2 x 10 @ 2.5 lbs Standing hip abduction 2 x 10 @ 1.5 lbs  Standing hip extension 2 x 10 @ 1.5 lbs  Sidelying hip abduction 2 x 10  Therapeutic Activity: Outdoor walking on inclined surface with quad cane Lateral step ups 4 inch x 10 each  Forward step ups 4 inch x 10 each Sit to stand with 6 lb med ball 2 x 10     [facility] Adult PT Treatment:                                                DATE: 02/08/24  Neuromuscular re-ed: Lateral step ups on airex x 10 each Calf raise on airex 2  x10  Forward step ups on airex x 10 each LAQ 2 x 10, 2.5 lbs  Therapeutic Activity: Outdoor walking on inclined surface with quad cane, (hill walking); SBA Curb negotiation with quad cane SBA Sit to stand 2 x 10 @ 5 lbs  [facility] Adult PT Treatment:                                                DATE: 02/01/24  Neuromuscular re-ed: SLR 2 x 10  Hip bridge 2 x 10  Resisted hip abduction blue band 2 x 10  Therapeutic Activity: Curb negotiation multiple trials with quad  cane Outdoor walking on inclined surfaces with quad cane, increased inclined compared to previous session; SBA  Leg press 2 x 10 @ 40 lbs    [facility] Adult PT Treatment:                                                DATE: 01/25/24 Therapeutic Exercise: LAQ 2 x 10 @ 2lbs  Standing toe raises 2 x 10  Side stepping 2 x 10 ft d/b Updated HEP   Therapeutic Activity: Curb negotiation multiple trials with HHA and quad cane Outdoor walking on inclined surfaces with quad cane and SBA Sit to stand x 10 holding ball                                            PATIENT EDUCATION:  Education details: HEP review  Person educated: Patient Education method: Programmer, multimedia,  Education comprehension: verbalized understanding,   HOME EXERCISE PROGRAM: Access Code: [email or web address] URL: [email or web address] Date: 01/25/2024 Prepared by: Forrestine Ike  Exercises - Supine Heel Slide  - 2-3 x daily - 7 x weekly - 1 sets - 10 reps - 5 sec  hold - Seated Knee Flexion AAROM  - 2-3 x daily - 7 x weekly - 1 sets - 10 reps - 5 sec  hold - Seated Long Arc Quad  - 1 x daily - 7 x weekly - 2 sets - 10 reps - Hooklying Clamshell with Resistance  - 1 x daily - 7 x weekly - 2 sets - 10 reps - Sit to Stand Without Arm Support  - 1 x daily - 7 x weekly - 3 sets - 5 reps - Standing Knee Flexion AROM with Chair Support  - 1 x daily - 7 x weekly - 2 sets - 10 reps - Heel Raises with Counter Support  - 1 x daily - 7 x weekly - 2 sets - 10 reps - Standing Hip Abduction with Counter Support  - 1 x daily - 7 x weekly - 2 sets - 10 reps - Standing Hip Extension with Counter Support  - 1 x daily - 7 x weekly - 2 sets - 10 reps - Toe Raises with Counter Support  - 1 x daily - 7 x weekly - 2 sets - 10 reps  ASSESSMENT:  CLINICAL IMPRESSION: Patient demonstrates improved stability with outdoor walking with ability to control speed with surfaces changes and declined walking. Focused on progression of standing  strengthening with good tolerance. Requires use of single UE support for step up activity having more difficulty initiating step up on the LLE secondary to weakness. No increase in knee pain reported throughout session.   Per eval - Patient is a 68 y.o. female who was seen today for  physical therapy evaluation and treatment for s/p Rt TKA on 11/11/23. She has very limited functional mobility currently requiring assistance with transfers and CGA for short duration ambulation. She demonstrates Rt knee ROM, Rt knee strength, gait and balance deficits. She will benefit from skilled PT to address the above stated deficits in order to return to optimal function, though we did discuss that she reach out to referring provider as she might be appropriate for home health PT currently given her limited mobility and ability to attend outpatient PT.   OBJECTIVE IMPAIRMENTS: Abnormal gait, decreased activity tolerance, decreased balance, decreased endurance, decreased knowledge of condition, decreased mobility, difficulty walking, decreased ROM, decreased strength, increased edema, improper body mechanics, and pain.   ACTIVITY LIMITATIONS: carrying, lifting, bending, standing, squatting, sleeping, stairs, transfers, bed mobility, bathing, dressing, hygiene/grooming, and locomotion level  PARTICIPATION LIMITATIONS: meal prep, cleaning, laundry, driving, shopping, community activity, and yard work  PERSONAL FACTORS: Age, Fitness, and 3+ comorbidities: see PMH above   are also affecting patient's functional outcome.   REHAB POTENTIAL: Good  CLINICAL DECISION MAKING: Unstable/unpredictable  EVALUATION COMPLEXITY: Moderate   GOALS: Goals reviewed with patient? Yes  SHORT TERM GOALS: Target date: 12/13/2023   Patient will be independent and compliant with initial HEP.   Baseline:  issued at eval  Goal status: MET   2.  Patient will demonstrate at least 90 degrees of Rt knee flexion AROM to improve ability to  complete sit to stand transfers.  Baseline: see above Goal status: MET    3.  Patient will improve Rt knee extension AROM by 10 degrees to improve gait mechanics.  Baseline: see above  Goal status: MET  4.  Patient will be independent with sit <> supine transfers.  Baseline: Min A  12/14/23: independent with sit to supine transfer  Goal status: MET    LONG TERM GOALS: Target date: 02/26/24  Patient will score >/= 55/100 interval score on the KOOS JR (MCID is 14) to signify clinically meaningful improvement in functional abilities.   Baseline: see above  Goal status: MET  2.  Patient will be able to ascend/descend stairs Mod I with handrail use.  Baseline: requires assistance  01/11/24: requires supervision  Goal status: progressing   3.  Patient will be able to ambulate community distances with LRAD.  Baseline: short household distances with RW  01/11/24: reports limited mobility due to lack of endurance (mostly household walking currently)  Goal status: progressing   4.  Patient will demonstrate at least 110 degrees of Rt knee flexion AROM to improve ability to complete sit to stand transfers.  Baseline: see above Goal status: progressing   5.  Patient will report pain at worst rated as </= 3/10 to reduce current functional limitations.  Baseline: 9 01/11/24: 3  Goal status: MET    PLAN:  PT FREQUENCY: 1x/week  PT DURATION: 6 weeks  PLANNED INTERVENTIONS: 97164- PT Re-evaluation, 97110-Therapeutic exercises, 97530- Therapeutic activity, 97112- Neuromuscular re-education, 97535- Self Care, 16109- Manual therapy, [id number]- Gait training, [id number]- Aquatic Therapy, 97014- Electrical stimulation (unattended), [id number]- Electrical stimulation (manual), 97016- Vasopneumatic device, Dry Needling, Cryotherapy, and Moist heat  PLAN FOR NEXT SESSION: review and progress HEP prn. Rt knee ROM and strengthening; standing strengthening.outdoor walking, balance activity. Anticipate d/c   Abishai Viegas, PT, DPT, ATC 02/15/24 9:28 AM

## 2024-02-22 ENCOUNTER — Ambulatory Visit

## 2024-02-22 DIAGNOSIS — M25561 Pain in right knee: Secondary | ICD-10-CM

## 2024-02-22 DIAGNOSIS — M25661 Stiffness of right knee, not elsewhere classified: Secondary | ICD-10-CM

## 2024-02-22 DIAGNOSIS — R600 Localized edema: Secondary | ICD-10-CM

## 2024-02-22 DIAGNOSIS — M6281 Muscle weakness (generalized): Secondary | ICD-10-CM

## 2024-02-22 DIAGNOSIS — R2689 Other abnormalities of gait and mobility: Secondary | ICD-10-CM

## 2024-02-22 NOTE — Therapy (Addendum)
 OUTPATIENT PHYSICAL THERAPY TREATMENT  PHYSICAL THERAPY DISCHARGE SUMMARY  Visits from Start of Care: 13  Current functional level related to goals / functional outcomes: All goals met   Remaining deficits: N/A   Education / Equipment: See education below    Patient agrees to discharge. Patient goals were met. Patient is being discharged due to meeting the stated rehab goals.   Patient Name: Terri Nicholson MRN: [id number] DOB:1956/07/17, 68 y.o., female Today's Date: 02/22/2024  END OF SESSION:  PT End of Session - 02/22/24 0757     Visit Number 13    Number of Visits 13    Date for PT Re-Evaluation 02/26/24    Authorization Type [employer]    PT Start Time 0800    PT Stop Time 0838    PT Time Calculation (min) 38 min    Activity Tolerance Patient tolerated treatment well    Behavior During Therapy WFL for tasks assessed/performed            Past Medical History:  Diagnosis Date   Arthritis    Asthma    Diabetes mellitus without complication (HCC)    Hypertension    IFG (impaired fasting glucose)    Past Surgical History:  Procedure Laterality Date   CARDIAC SURGERY     CESAREAN SECTION     EYE SURGERY     HERNIA REPAIR     Patient Active Problem List   Diagnosis Date Noted   Uncontrolled type 2 diabetes mellitus with hyperglycemia (HCC) 12/14/2018   Complete rotator cuff tear 01/05/2018   Palpitations 03/04/2017   DIABETES MELLITUS, TYPE II 09/19/2007   HYPERTENSION 09/19/2007    PCP: Arloa Fallow, MD  REFERRING PROVIDER: Maude Herald, MD  REFERRING DIAG: s/p Rt total knee replacement   THERAPY DIAG:  Acute pain of right knee  Stiffness of right knee, not elsewhere classified  Localized edema  Muscle weakness (generalized)  Other abnormalities of gait and mobility  Rationale for Evaluation and Treatment: Rehabilitation  ONSET DATE: 11/11/23  SUBJECTIVE:   Per eval - Patient underwent Rt TKA on 11/11/23. She had a fall on  Saturday when trying to transfer off the toilet. She had X-rays taken after the fall and everything looked fine. Patient reports she is walking several times daily with her RW for short distances. She has been completing some of the exercises provided post-op, but they are rough. Patient reports difficulty with walking, stairs, transfers, and putting weight on the RLE. She currently needs assistance with sit to stand transfers and it was really difficult getting in/out of the car today.   SUBJECTIVE STATEMENT:   Patient reports she feels like she has made 200% improvement since surgery. Patient states she has mild lateral knee pain today, states it feels like it fluctuate with the weather.   PERTINENT HISTORY: Diabetes   PAIN:  Are you having pain? Yes: NPRS scale: 1/10 Pain location: Rt knee Pain description: tight,sore Aggravating factors: walking, transfers  Relieving factors: ice, pain medication   PRECAUTIONS: Fall  RED FLAGS: None   WEIGHT BEARING RESTRICTIONS: Yes WBAT RLE  FALLS:  Has patient fallen in last 6 months? Yes. Number of falls 5; mostly from tripping, most recent fall post-op when she was getting off the toilet  LIVING ENVIRONMENT: Lives with: lives with their spouse Lives in: House/apartment Stairs: Yes: Internal: flight steps; on left going up and External: 3 steps; can reach both Has following equipment at home: Single point cane, Walker - 2  wheeled, and Wheelchair (manual)  OCCUPATION: retired   PLOF: Independent  PATIENT GOALS: to be able to have ADLs that are normal. Be functionally independent.   NEXT MD VISIT: 01/27/24  OBJECTIVE:  Note: Objective measures were completed at Evaluation unless otherwise noted.  DIAGNOSTIC FINDINGS: no recent imaging on file   PATIENT SURVEYS:  KOOS JR: 36/100 interval; 20/28   01/11/24: KOOS JR: 7/28; 68/100  COGNITION: Overall cognitive status: Within functional limits for tasks  assessed     SENSATION: Not tested  EDEMA:  Bandages donned, though moderate edema present about the Rt knee    POSTURE: maintains RLE in extension in seated   PALPATION: Not assessed   LOWER EXTREMITY ROM:  Active ROM Right eval Left eval Right 11/30/23 12/21/23 Right  12/28/23 Right  01/11/24 Right  02/08/24 Right  02/22/24 Right  Hip flexion          Hip extension          Hip abduction          Hip adduction          Hip internal rotation          Hip external rotation          Knee flexion 60  A: 89 deg seated 86 95 99 106 110  Knee extension Lacking 13   Lacking 1  0 0    Ankle dorsiflexion          Ankle plantarflexion          Ankle inversion          Ankle eversion           (Blank rows = not tested)  LOWER EXTREMITY MMT:  MMT Right eval Left eval 01/11/24 Right   Hip flexion     Hip extension     Hip abduction     Hip adduction     Hip internal rotation     Hip external rotation     Knee flexion   5  Knee extension 3-  4  Ankle dorsiflexion     Ankle plantarflexion     Ankle inversion     Ankle eversion      (Blank rows = not tested)   FUNCTIONAL TESTS:  Sit <> stand CGA Sit <> Supine Min A  GAIT: Distance walked: 5 ft  Assistive device utilized: Environmental consultant - 2 wheeled Level of assistance: CGA Comments: step to pattern, heavy use of UE support   [facility] Adult PT Treatment:                                                DATE: 02/22/2024 Therapeutic Exercise: NuStep L3-5 x 6 min + subjective intake Updating goals Passive knee flexion ROM  AAROM heel slides with strap Knee flexion measurement Therapeutic Activity: Stair navigation ascend/descend flight of stairs + one railing & SPC Walking indoor building for community distance (raining outside)    [facility] Adult PT Treatment:                                                DATE: 02/15/24 Neuromuscular re-ed: LAQ 2 x 10 @ 2.5 lbs Standing hip abduction 2 x 10 @  1.5 lbs  Standing hip extension 2 x  10 @ 1.5 lbs  Sidelying hip abduction 2 x 10  Therapeutic Activity: Outdoor walking on inclined surface with quad cane Lateral step ups 4 inch x 10 each  Forward step ups 4 inch x 10 each Sit to stand with 6 lb med ball 2 x 10     [facility] Adult PT Treatment:                                                DATE: 02/08/24 Neuromuscular re-ed: Lateral step ups on airex x 10 each Calf raise on airex 2  x10  Forward step ups on airex x 10 each LAQ 2 x 10, 2.5 lbs  Therapeutic Activity: Outdoor walking on inclined surface with quad cane, (hill walking); SBA Curb negotiation with quad cane SBA Sit to stand 2 x 10 @ 5 lbs                                PATIENT EDUCATION:  Education details: HEP review  Person educated: Patient Education method: Programmer, multimedia,  Education comprehension: verbalized understanding,   HOME EXERCISE PROGRAM: Access Code: [email or web address] URL: [email or web address] Date: 01/25/2024 Prepared by: Lucie Meeter  Exercises - Supine Heel Slide  - 2-3 x daily - 7 x weekly - 1 sets - 10 reps - 5 sec  hold - Seated Knee Flexion AAROM  - 2-3 x daily - 7 x weekly - 1 sets - 10 reps - 5 sec  hold - Seated Long Arc Quad  - 1 x daily - 7 x weekly - 2 sets - 10 reps - Hooklying Clamshell with Resistance  - 1 x daily - 7 x weekly - 2 sets - 10 reps - Sit to Stand Without Arm Support  - 1 x daily - 7 x weekly - 3 sets - 5 reps - Standing Knee Flexion AROM with Chair Support  - 1 x daily - 7 x weekly - 2 sets - 10 reps - Heel Raises with Counter Support  - 1 x daily - 7 x weekly - 2 sets - 10 reps - Standing Hip Abduction with Counter Support  - 1 x daily - 7 x weekly - 2 sets - 10 reps - Standing Hip Extension with Counter Support  - 1 x daily - 7 x weekly - 2 sets - 10 reps - Toe Raises with Counter Support  - 1 x daily - 7 x weekly - 2 sets - 10 reps  ASSESSMENT:  CLINICAL IMPRESSION: Patient has met all goals in POC, including knee flexion to 110 degrees with  assistance. Patient able to ascend/descend one flight of stairs with one hand rail and SPC with step together stepping pattern. Discussion with patient in establishing daily walking program and gradually increasing distance as tolerated, along with compliance with HEP to continues progressing endurance with physical activity.  Per eval - Patient is a 68 y.o. female who was seen today for physical therapy evaluation and treatment for s/p Rt TKA on 11/11/23. She has very limited functional mobility currently requiring assistance with transfers and CGA for short duration ambulation. She demonstrates Rt knee ROM, Rt knee strength, gait and balance deficits. She will benefit from skilled PT to address the above stated deficits in order  to return to optimal function, though we did discuss that she reach out to referring provider as she might be appropriate for home health PT currently given her limited mobility and ability to attend outpatient PT.   OBJECTIVE IMPAIRMENTS: Abnormal gait, decreased activity tolerance, decreased balance, decreased endurance, decreased knowledge of condition, decreased mobility, difficulty walking, decreased ROM, decreased strength, increased edema, improper body mechanics, and pain.   ACTIVITY LIMITATIONS: carrying, lifting, bending, standing, squatting, sleeping, stairs, transfers, bed mobility, bathing, dressing, hygiene/grooming, and locomotion level  PARTICIPATION LIMITATIONS: meal prep, cleaning, laundry, driving, shopping, community activity, and yard work  PERSONAL FACTORS: Age, Fitness, and 3+ comorbidities: see PMH above  are also affecting patient's functional outcome.   REHAB POTENTIAL: Good  CLINICAL DECISION MAKING: Unstable/unpredictable  EVALUATION COMPLEXITY: Moderate   GOALS: Goals reviewed with patient? Yes  SHORT TERM GOALS: Target date: 12/13/2023  Patient will be independent and compliant with initial HEP.  Baseline:  issued at eval  Goal status:  MET   2.  Patient will demonstrate at least 90 degrees of Rt knee flexion AROM to improve ability to complete sit to stand transfers.  Baseline: see above Goal status: MET    3.  Patient will improve Rt knee extension AROM by 10 degrees to improve gait mechanics.  Baseline: see above  Goal status: MET  4.  Patient will be independent with sit <> supine transfers.  Baseline: Min A  12/14/23: independent with sit to supine transfer  Goal status: MET    LONG TERM GOALS: Target date: 02/26/24  Patient will score >/= 55/100 interval score on the KOOS JR (MCID is 14) to signify clinically meaningful improvement in functional abilities.  Baseline: see above  Goal status: MET  2.  Patient will be able to ascend/descend stairs Mod I with handrail use.  Baseline: requires assistance  01/11/24: requires supervision  02/22/24:  Goal status: MET   3.  Patient will be able to ambulate community distances with LRAD.  Baseline: short household distances with RW  01/11/24: reports limited mobility due to lack of endurance (mostly household walking currently)  02/22/24: Wide base cane Goal status: MET  4.  Patient will demonstrate at least 110 degrees of Rt knee flexion AROM to improve ability to complete sit to stand transfers.  Baseline: see above 02/22/24: 110 Goal status: MET   5.  Patient will report pain at worst rated as </= 3/10 to reduce current functional limitations.  Baseline: 9 01/11/24: 3  Goal status: MET    PLAN:  PT FREQUENCY: 1x/week  PT DURATION: 6 weeks  PLANNED INTERVENTIONS: 97164- PT Re-evaluation, 97110-Therapeutic exercises, 97530- Therapeutic activity, 97112- Neuromuscular re-education, 97535- Self Care, [id number]- Manual therapy, [id number]- Gait training, [id number]- Aquatic Therapy, 97014- Electrical stimulation (unattended), [id number]- Electrical stimulation (manual), 97016- Vasopneumatic device, Dry Needling, Cryotherapy, and Moist heat    Lamarr Price, PTA 02/22/24 8:45  AM  Lucie Meeter, PT, DPT, ATC 06/09/24 8:27 AM
